# Patient Record
Sex: Male | Born: 1945 | Race: White | Hispanic: No | Marital: Married | Smoking: Current every day smoker
Health system: Southern US, Community
[De-identification: ages and names within clinical notes are randomized; demographics above are authoritative.]

## PROBLEM LIST (undated history)

## (undated) DIAGNOSIS — F1020 Alcohol dependence, uncomplicated: Secondary | ICD-10-CM

## (undated) DIAGNOSIS — B1920 Unspecified viral hepatitis C without hepatic coma: Secondary | ICD-10-CM

## (undated) DIAGNOSIS — C8590 Non-Hodgkin lymphoma, unspecified, unspecified site: Secondary | ICD-10-CM

## (undated) DIAGNOSIS — E119 Type 2 diabetes mellitus without complications: Secondary | ICD-10-CM

## (undated) DIAGNOSIS — I10 Essential (primary) hypertension: Secondary | ICD-10-CM

## (undated) DIAGNOSIS — Z720 Tobacco use: Secondary | ICD-10-CM

---

## 1998-08-05 ENCOUNTER — Ambulatory Visit (HOSPITAL_COMMUNITY): Admission: RE | Admit: 1998-08-05 | Discharge: 1998-08-05 | Payer: Self-pay | Admitting: Gastroenterology

## 1998-08-05 ENCOUNTER — Encounter: Payer: Self-pay | Admitting: Gastroenterology

## 1999-01-09 ENCOUNTER — Encounter: Admission: RE | Admit: 1999-01-09 | Discharge: 1999-04-09 | Payer: Self-pay | Admitting: Emergency Medicine

## 2000-12-09 ENCOUNTER — Ambulatory Visit (HOSPITAL_COMMUNITY): Admission: RE | Admit: 2000-12-09 | Discharge: 2000-12-09 | Payer: Self-pay | Admitting: Gastroenterology

## 2004-09-14 HISTORY — PX: DEEP NECK LYMPH NODE BIOPSY / EXCISION: SUR126

## 2005-08-28 ENCOUNTER — Emergency Department (HOSPITAL_COMMUNITY): Admission: EM | Admit: 2005-08-28 | Discharge: 2005-08-28 | Payer: Self-pay | Admitting: Emergency Medicine

## 2005-08-31 ENCOUNTER — Other Ambulatory Visit: Admission: RE | Admit: 2005-08-31 | Discharge: 2005-08-31 | Payer: Self-pay | Admitting: Otolaryngology

## 2005-09-17 ENCOUNTER — Ambulatory Visit (HOSPITAL_COMMUNITY): Admission: RE | Admit: 2005-09-17 | Discharge: 2005-09-17 | Payer: Self-pay | Admitting: Otolaryngology

## 2006-09-14 DIAGNOSIS — C8590 Non-Hodgkin lymphoma, unspecified, unspecified site: Secondary | ICD-10-CM

## 2006-09-14 HISTORY — DX: Non-Hodgkin lymphoma, unspecified, unspecified site: C85.90

## 2012-12-16 ENCOUNTER — Emergency Department (INDEPENDENT_AMBULATORY_CARE_PROVIDER_SITE_OTHER)
Admission: EM | Admit: 2012-12-16 | Discharge: 2012-12-16 | Disposition: A | Discharge status: Home / Self Care | Payer: Medicare Other | Source: Home / Self Care | Attending: Emergency Medicine | Admitting: Emergency Medicine

## 2012-12-16 ENCOUNTER — Encounter (HOSPITAL_COMMUNITY): Payer: Self-pay | Admitting: *Deleted

## 2012-12-16 DIAGNOSIS — K047 Periapical abscess without sinus: Secondary | ICD-10-CM

## 2012-12-16 HISTORY — DX: Non-Hodgkin lymphoma, unspecified, unspecified site: C85.90

## 2012-12-16 HISTORY — DX: Essential (primary) hypertension: I10

## 2012-12-16 HISTORY — DX: Type 2 diabetes mellitus without complications: E11.9

## 2012-12-16 MED ORDER — CLINDAMYCIN HCL 300 MG PO CAPS: 300.0000 mg | ORAL_CAPSULE | Freq: Four times a day (QID) | ORAL | Status: AC

## 2012-12-16 MED ORDER — HYDROCODONE-ACETAMINOPHEN 5-325 MG PO TABS
1.0000 | ORAL_TABLET | Freq: Once | ORAL | Status: AC
  Administered 2012-12-16: 1 via ORAL

## 2012-12-16 MED ORDER — HYDROCODONE-ACETAMINOPHEN 5-325 MG PO TABS: ORAL_TABLET | ORAL | Status: DC

## 2012-12-16 MED ORDER — IBUPROFEN 800 MG PO TABS
800.0000 mg | ORAL_TABLET | Freq: Once | ORAL | Status: AC
  Administered 2012-12-16: 800 mg via ORAL

## 2012-12-16 MED ORDER — NAPROXEN 500 MG PO TABS: 500.0000 mg | ORAL_TABLET | Freq: Two times a day (BID) | ORAL | Status: DC

## 2012-12-16 MED ORDER — IBUPROFEN 800 MG PO TABS
ORAL_TABLET | ORAL | Status: AC
  Filled 2012-12-16: qty 1

## 2012-12-16 MED ORDER — HYDROCODONE-ACETAMINOPHEN 5-325 MG PO TABS
ORAL_TABLET | ORAL | Status: AC
  Filled 2012-12-16: qty 1

## 2012-12-16 NOTE — ED Notes (Signed)
C/o swelling and pain to R lower jaw onset yesterday morning.  Can't afford to see his dentist.  Saw her in Jan. and spent $550.  Pt. goes to [facility] as his PCP.  They don't offer dental care but may pull teeth in an emergancy.

## 2012-12-16 NOTE — ED Provider Notes (Signed)
Chief Complaint:  Toothache.  History of Present Illness:   [name] is a 66 year old male with type 2 diabetes who presents with a one-day history of a painful tooth in his right lower jaw with swelling externally. It hurts to chew on that side. He is no difficulty swallowing or breathing. He denies headache, fever, neck pain, or difficulty breathing. He has had dental problems for years and had numerous crowns which, off in the teeth are not decayed. He has not have a dentist locally. He gets his medical care through the [place] system.  Review of Systems:  Other than noted above, the patient denies any of the following symptoms: Systemic:  No fever, chills, sweats or weight loss. ENT:  No headache, ear ache, sore throat, nasal congestion, facial pain, or swelling. Lymphatic:  No adenopathy. Lungs:  No coughing, wheezing or shortness of breath.  PMFSH:  Past medical history, family history, social history, meds, and allergies were reviewed. He has no known medication allergies. He has type 2 diabetes, non-Hodgkin's lymphoma which is under control, and diabetic peripheral neuropathy. He takes nortriptyline and a medication for diabetes and high blood pressure.  Physical Exam:   Vital signs:  BP 134/75  Pulse 81  Temp(Src) 98.4 F (36.9 C) (Oral)  Resp 16 General:  Alert, oriented, in no distress. ENT:  TMs and canals normal.  Nasal mucosa normal. Mouth exam:  He has widespread dental decay. Very few teeth are intact. There are multiple decayed teeth in the right lower jaw is hard to say which one it is infected. There is no swelling of the gingiva, no collection of pus, no swelling of the floor the mouth. The pharynx is clear and airway is widely patent. He does have swelling of the right side of the jaw externally but no erythema or heat. This is tender to palpation. Neck:  No swelling or adenopathy. Lungs:  Breath sounds clear and equal bilaterally.  No wheezes, rales or rhonchi. Heart:   Regular rhythm.  No gallops or murmers. Skin:  Clear, warm and dry.  Course in Urgent Care Center:   He was given ibuprofen 800 mg by mouth and Norco 5/325.   Assessment:  The encounter diagnosis was Dental abscess.  He has widespread dental decay he needs to see a dentist as soon as possible. He was given names of some dental resources here in the community, although he may want to go to the [place] system. He was warned that this could cause swelling of the tongue and difficulty breathing and to return to the emergency room right away if this should happen. There is no evidence right now of swelling of the floor the mouth.  Plan:   1.  The following meds were prescribed:   New Prescriptions   CLINDAMYCIN (CLEOCIN) 300 MG CAPSULE    Take 1 capsule (300 mg total) by mouth 4 (four) times daily.   HYDROCODONE-ACETAMINOPHEN (NORCO/VICODIN) 5-325 MG PER TABLET    1 to 2 tabs every 4 to 6 hours as needed for pain.   NAPROXEN (NAPROSYN) 500 MG TABLET    Take 1 tablet (500 mg total) by mouth 2 (two) times daily.   2.  The patient was instructed in symptomatic care and handouts were given. 3.  The patient was told to return if becoming worse in any way, if no better in 3 or 4 days, and given some red flag symptoms such as difficulty breathing or high fever that would indicate earlier  return, especially difficulty breathing. 4.  The patient was told to follow up with a dentist as soon as possible.    Reuben Likes, MD 12/16/12 [phone]

## 2014-05-06 ENCOUNTER — Emergency Department (HOSPITAL_COMMUNITY): Payer: Medicare Other

## 2014-05-06 ENCOUNTER — Encounter (HOSPITAL_COMMUNITY): Payer: Self-pay | Admitting: Emergency Medicine

## 2014-05-06 ENCOUNTER — Inpatient Hospital Stay (HOSPITAL_COMMUNITY)
Admission: EM | Admit: 2014-05-06 | Discharge: 2014-05-15 | DRG: 296 | Disposition: E | Payer: Medicare Other | Attending: Emergency Medicine | Admitting: Emergency Medicine

## 2014-05-06 ENCOUNTER — Inpatient Hospital Stay (HOSPITAL_COMMUNITY): Payer: Medicare Other

## 2014-05-06 DIAGNOSIS — I469 Cardiac arrest, cause unspecified: Principal | ICD-10-CM | POA: Diagnosis present

## 2014-05-06 DIAGNOSIS — N179 Acute kidney failure, unspecified: Secondary | ICD-10-CM | POA: Diagnosis present

## 2014-05-06 DIAGNOSIS — G931 Anoxic brain damage, not elsewhere classified: Secondary | ICD-10-CM | POA: Diagnosis present

## 2014-05-06 DIAGNOSIS — C8589 Other specified types of non-Hodgkin lymphoma, extranodal and solid organ sites: Secondary | ICD-10-CM | POA: Diagnosis present

## 2014-05-06 DIAGNOSIS — J9600 Acute respiratory failure, unspecified whether with hypoxia or hypercapnia: Secondary | ICD-10-CM

## 2014-05-06 DIAGNOSIS — R570 Cardiogenic shock: Secondary | ICD-10-CM | POA: Diagnosis present

## 2014-05-06 DIAGNOSIS — Z8249 Family history of ischemic heart disease and other diseases of the circulatory system: Secondary | ICD-10-CM

## 2014-05-06 DIAGNOSIS — I2699 Other pulmonary embolism without acute cor pulmonale: Secondary | ICD-10-CM | POA: Diagnosis present

## 2014-05-06 DIAGNOSIS — E876 Hypokalemia: Secondary | ICD-10-CM | POA: Diagnosis present

## 2014-05-06 DIAGNOSIS — B1920 Unspecified viral hepatitis C without hepatic coma: Secondary | ICD-10-CM | POA: Diagnosis present

## 2014-05-06 DIAGNOSIS — R4020 Unspecified coma: Secondary | ICD-10-CM | POA: Diagnosis present

## 2014-05-06 DIAGNOSIS — F1020 Alcohol dependence, uncomplicated: Secondary | ICD-10-CM | POA: Diagnosis present

## 2014-05-06 DIAGNOSIS — I4510 Unspecified right bundle-branch block: Secondary | ICD-10-CM | POA: Diagnosis present

## 2014-05-06 DIAGNOSIS — C8590 Non-Hodgkin lymphoma, unspecified, unspecified site: Secondary | ICD-10-CM | POA: Diagnosis present

## 2014-05-06 DIAGNOSIS — Z791 Long term (current) use of non-steroidal anti-inflammatories (NSAID): Secondary | ICD-10-CM | POA: Diagnosis not present

## 2014-05-06 DIAGNOSIS — E872 Acidosis, unspecified: Secondary | ICD-10-CM | POA: Diagnosis present

## 2014-05-06 DIAGNOSIS — J9601 Acute respiratory failure with hypoxia: Secondary | ICD-10-CM | POA: Diagnosis present

## 2014-05-06 DIAGNOSIS — I10 Essential (primary) hypertension: Secondary | ICD-10-CM | POA: Diagnosis present

## 2014-05-06 DIAGNOSIS — J4489 Other specified chronic obstructive pulmonary disease: Secondary | ICD-10-CM | POA: Diagnosis present

## 2014-05-06 DIAGNOSIS — J449 Chronic obstructive pulmonary disease, unspecified: Secondary | ICD-10-CM | POA: Diagnosis present

## 2014-05-06 DIAGNOSIS — Z66 Do not resuscitate: Secondary | ICD-10-CM | POA: Diagnosis present

## 2014-05-06 DIAGNOSIS — I498 Other specified cardiac arrhythmias: Secondary | ICD-10-CM | POA: Diagnosis present

## 2014-05-06 DIAGNOSIS — F17200 Nicotine dependence, unspecified, uncomplicated: Secondary | ICD-10-CM | POA: Diagnosis present

## 2014-05-06 DIAGNOSIS — E119 Type 2 diabetes mellitus without complications: Secondary | ICD-10-CM | POA: Diagnosis present

## 2014-05-06 HISTORY — DX: Tobacco use: Z72.0

## 2014-05-06 HISTORY — DX: Alcohol dependence, uncomplicated: F10.20

## 2014-05-06 HISTORY — DX: Unspecified viral hepatitis C without hepatic coma: B19.20

## 2014-05-06 LAB — PRO B NATRIURETIC PEPTIDE: Pro B Natriuretic peptide (BNP): 589.1 pg/mL — ABNORMAL HIGH (ref 0–125)

## 2014-05-06 LAB — BASIC METABOLIC PANEL
ANION GAP: 17 — AB (ref 5–15)
ANION GAP: 15 (ref 5–15)
BUN: 11 mg/dL (ref 6–23)
BUN: 14 mg/dL (ref 6–23)
CALCIUM: 6.9 mg/dL — AB (ref 8.4–10.5)
CO2: 19 meq/L (ref 19–32)
CO2: 21 meq/L (ref 19–32)
Calcium: 7.4 mg/dL — ABNORMAL LOW (ref 8.4–10.5)
Chloride: 98 mEq/L (ref 96–112)
Chloride: 96 mEq/L (ref 96–112)
Creatinine, Ser: 0.85 mg/dL (ref 0.50–1.35)
Creatinine, Ser: 0.97 mg/dL (ref 0.50–1.35)
GFR calc Af Amer: >90 mL/min (ref >90)
GFR calc Af Amer: >90 mL/min (ref >90)
GFR calc non Af Amer: 88 mL/min — ABNORMAL LOW (ref >90)
GFR, EST NON AFRICAN AMERICAN: 83 mL/min — AB (ref >90)
Glucose, Bld: 179 mg/dL — ABNORMAL HIGH (ref 70–99)
Glucose, Bld: 228 mg/dL — ABNORMAL HIGH (ref 70–99)
POTASSIUM: 4 meq/L (ref 3.7–5.3)
POTASSIUM: 4 meq/L (ref 3.7–5.3)
SODIUM: 134 meq/L — AB (ref 137–147)
SODIUM: 132 meq/L — AB (ref 137–147)

## 2014-05-06 LAB — POCT I-STAT, CHEM 8
BUN: 11 mg/dL (ref 6–23)
BUN: 14 mg/dL (ref 6–23)
BUN: 16 mg/dL (ref 6–23)
CALCIUM ION: 1.08 mmol/L — AB (ref 1.13–1.30)
CHLORIDE: 97 meq/L (ref 96–112)
CREATININE: 1.1 mg/dL (ref 0.50–1.35)
CREATININE: 1.1 mg/dL (ref 0.50–1.35)
Calcium, Ion: 1 mmol/L — ABNORMAL LOW (ref 1.13–1.30)
Calcium, Ion: 1.01 mmol/L — ABNORMAL LOW (ref 1.13–1.30)
Chloride: 100 mEq/L (ref 96–112)
Chloride: 99 mEq/L (ref 96–112)
Creatinine, Ser: 1 mg/dL (ref 0.50–1.35)
GLUCOSE: 192 mg/dL — AB (ref 70–99)
Glucose, Bld: 238 mg/dL — ABNORMAL HIGH (ref 70–99)
Glucose, Bld: 222 mg/dL — ABNORMAL HIGH (ref 70–99)
HCT: 45 % (ref 39.0–52.0)
HCT: 48 % (ref 39.0–52.0)
HEMATOCRIT: 48 % (ref 39.0–52.0)
HEMOGLOBIN: 15.3 g/dL (ref 13.0–17.0)
Hemoglobin: 16.3 g/dL (ref 13.0–17.0)
Hemoglobin: 16.3 g/dL (ref 13.0–17.0)
POTASSIUM: 4.2 meq/L (ref 3.7–5.3)
POTASSIUM: 3.9 meq/L (ref 3.7–5.3)
Potassium: 3.7 mEq/L (ref 3.7–5.3)
SODIUM: 135 meq/L — AB (ref 137–147)
Sodium: 135 mEq/L — ABNORMAL LOW (ref 137–147)
Sodium: 136 mEq/L — ABNORMAL LOW (ref 137–147)
TCO2: 21 mmol/L (ref 0–100)
TCO2: 22 mmol/L (ref 0–100)
TCO2: 22 mmol/L (ref 0–100)

## 2014-05-06 LAB — GLUCOSE, CAPILLARY
GLUCOSE-CAPILLARY: 172 mg/dL — AB (ref 70–99)
Glucose-Capillary: 222 mg/dL — ABNORMAL HIGH (ref 70–99)
Glucose-Capillary: 244 mg/dL — ABNORMAL HIGH (ref 70–99)

## 2014-05-06 LAB — POCT I-STAT 3, ART BLOOD GAS (G3+)
Acid-base deficit: 3 mmol/L — ABNORMAL HIGH (ref 0.0–2.0)
BICARBONATE: 22.1 meq/L (ref 20.0–24.0)
O2 SAT: 94 %
PCO2 ART: 33.6 mmHg — AB (ref 35.0–45.0)
PO2 ART: 58 mmHg — AB (ref 80.0–100.0)
Patient temperature: 33.2
TCO2: 23 mmol/L (ref 0–100)
pH, Arterial: 7.408 (ref 7.350–7.450)

## 2014-05-06 LAB — COMPREHENSIVE METABOLIC PANEL
ALT: 163 U/L — ABNORMAL HIGH (ref 0–53)
ANION GAP: 22 — AB (ref 5–15)
AST: 388 U/L — ABNORMAL HIGH (ref 0–37)
Albumin: 3 g/dL — ABNORMAL LOW (ref 3.5–5.2)
Alkaline Phosphatase: 101 U/L (ref 39–117)
BUN: 13 mg/dL (ref 6–23)
CALCIUM: 9.2 mg/dL (ref 8.4–10.5)
CO2: 20 meq/L (ref 19–32)
Chloride: 87 mEq/L — ABNORMAL LOW (ref 96–112)
Creatinine, Ser: 1.35 mg/dL (ref 0.50–1.35)
GFR, EST AFRICAN AMERICAN: 61 mL/min — AB (ref >90)
GFR, EST NON AFRICAN AMERICAN: 52 mL/min — AB (ref >90)
GLUCOSE: 464 mg/dL — AB (ref 70–99)
Potassium: 3.7 mEq/L (ref 3.7–5.3)
Sodium: 129 mEq/L — ABNORMAL LOW (ref 137–147)
TOTAL PROTEIN: 7.4 g/dL (ref 6.0–8.3)
Total Bilirubin: 0.4 mg/dL (ref 0.3–1.2)

## 2014-05-06 LAB — I-STAT ARTERIAL BLOOD GAS, ED
ACID-BASE DEFICIT: 11 mmol/L — AB (ref 0.0–2.0)
BICARBONATE: 19.7 meq/L — AB (ref 20.0–24.0)
O2 Saturation: 97 %
Patient temperature: 33.6
TCO2: 22 mmol/L (ref 0–100)
pCO2 arterial: 58.3 mmHg (ref 35.0–45.0)
pH, Arterial: 7.114 — CL (ref 7.350–7.450)
pO2, Arterial: 114 mmHg — ABNORMAL HIGH (ref 80.0–100.0)

## 2014-05-06 LAB — TROPONIN I: Troponin I: <0.3 ng/mL (ref <0.30)

## 2014-05-06 LAB — CBG MONITORING, ED: Glucose-Capillary: 282 mg/dL — ABNORMAL HIGH (ref 70–99)

## 2014-05-06 LAB — I-STAT CG4 LACTIC ACID, ED: Lactic Acid, Venous: 10.45 mmol/L — ABNORMAL HIGH (ref 0.5–2.2)

## 2014-05-06 LAB — CBC
HEMATOCRIT: 44.4 % (ref 39.0–52.0)
HEMOGLOBIN: 15.4 g/dL (ref 13.0–17.0)
MCH: 34.5 pg — AB (ref 26.0–34.0)
MCHC: 34.7 g/dL (ref 30.0–36.0)
MCV: 99.3 fL (ref 78.0–100.0)
Platelets: 128 10*3/uL — ABNORMAL LOW (ref 150–400)
RBC: 4.47 MIL/uL (ref 4.22–5.81)
RDW: 12.3 % (ref 11.5–15.5)
WBC: 13.1 10*3/uL — ABNORMAL HIGH (ref 4.0–10.5)

## 2014-05-06 LAB — PROTIME-INR
INR: 1.29 (ref 0.00–1.49)
INR: 1.6 — ABNORMAL HIGH (ref 0.00–1.49)
PROTHROMBIN TIME: 16.1 s — AB (ref 11.6–15.2)
PROTHROMBIN TIME: 19.1 s — AB (ref 11.6–15.2)

## 2014-05-06 LAB — I-STAT TROPONIN, ED: TROPONIN I, POC: 0.06 ng/mL (ref 0.00–0.08)

## 2014-05-06 LAB — MRSA PCR SCREENING: MRSA BY PCR: POSITIVE — AB

## 2014-05-06 LAB — MAGNESIUM: MAGNESIUM: 2.3 mg/dL (ref 1.5–2.5)

## 2014-05-06 LAB — APTT: APTT: 64 s — AB (ref 24–37)

## 2014-05-06 MED ORDER — CETYLPYRIDINIUM CHLORIDE 0.05 % MT LIQD
7.0000 mL | Freq: Four times a day (QID) | OROMUCOSAL | Status: DC
  Administered 2014-05-06 – 2014-05-07 (×4): 7 mL via OROMUCOSAL

## 2014-05-06 MED ORDER — ETOMIDATE 2 MG/ML IV SOLN
INTRAVENOUS | Status: AC
  Filled 2014-05-06: qty 20

## 2014-05-06 MED ORDER — ASPIRIN 300 MG RE SUPP: 300.0000 mg | RECTAL | Status: AC

## 2014-05-06 MED ORDER — CISATRACURIUM BOLUS VIA INFUSION
0.1000 mg/kg | Freq: Once | INTRAVENOUS | Status: AC
  Administered 2014-05-06: 9.1 mg via INTRAVENOUS
  Filled 2014-05-06: qty 10

## 2014-05-06 MED ORDER — ROCURONIUM BROMIDE 50 MG/5ML IV SOLN
INTRAVENOUS | Status: DC
Start: 2014-05-06 — End: 2014-05-06
  Filled 2014-05-06: qty 2

## 2014-05-06 MED ORDER — SODIUM CHLORIDE 0.9 % IV SOLN
10.0000 ug/h | INTRAVENOUS | Status: AC
  Administered 2014-05-06: 10 ug/h via INTRAVENOUS
  Filled 2014-05-06: qty 50

## 2014-05-06 MED ORDER — FENTANYL CITRATE 0.05 MG/ML IJ SOLN
INTRAMUSCULAR | Status: AC
  Filled 2014-05-06: qty 2

## 2014-05-06 MED ORDER — SODIUM CHLORIDE 0.9 % IV SOLN
1.0000 ug/kg/min | INTRAVENOUS | Status: DC
  Filled 2014-05-06: qty 20

## 2014-05-06 MED ORDER — SODIUM CHLORIDE 0.9 % IV SOLN
1.0000 ug/kg/min | INTRAVENOUS | Status: DC
  Administered 2014-05-06: 1 ug/kg/min via INTRAVENOUS
  Filled 2014-05-06: qty 20

## 2014-05-06 MED ORDER — SUCCINYLCHOLINE CHLORIDE 20 MG/ML IJ SOLN
INTRAMUSCULAR | Status: DC
Start: 2014-05-06 — End: 2014-05-06
  Filled 2014-05-06: qty 1

## 2014-05-06 MED ORDER — NOREPINEPHRINE BITARTRATE 1 MG/ML IV SOLN
0.5000 ug/min | INTRAVENOUS | Status: DC
  Administered 2014-05-06: 3 ug/min via INTRAVENOUS
  Filled 2014-05-06: qty 4

## 2014-05-06 MED ORDER — CISATRACURIUM BOLUS VIA INFUSION
0.0500 mg/kg | INTRAVENOUS | Status: DC | PRN
  Filled 2014-05-06: qty 5

## 2014-05-06 MED ORDER — PANTOPRAZOLE SODIUM 40 MG IV SOLR
40.0000 mg | INTRAVENOUS | Status: DC
  Administered 2014-05-06: 40 mg via INTRAVENOUS
  Filled 2014-05-06 (×2): qty 40

## 2014-05-06 MED ORDER — INSULIN ASPART 100 UNIT/ML ~~LOC~~ SOLN
2.0000 [IU] | SUBCUTANEOUS | Status: DC
  Administered 2014-05-06: 6 [IU] via SUBCUTANEOUS

## 2014-05-06 MED ORDER — NOREPINEPHRINE BITARTRATE 1 MG/ML IV SOLN
0.5000 ug/min | INTRAVENOUS | Status: DC
  Filled 2014-05-06: qty 4

## 2014-05-06 MED ORDER — SODIUM CHLORIDE 0.9 % IV BOLUS (SEPSIS)
1000.0000 mL | Freq: Once | INTRAVENOUS | Status: AC
  Administered 2014-05-06: 1000 mL via INTRAVENOUS

## 2014-05-06 MED ORDER — FENTANYL CITRATE 0.05 MG/ML IJ SOLN
25.0000 ug/h | INTRAMUSCULAR | Status: DC
  Administered 2014-05-06: 100 ug/h via INTRAVENOUS
  Administered 2014-05-07: 175 ug/h via INTRAVENOUS
  Filled 2014-05-06: qty 50

## 2014-05-06 MED ORDER — FENTANYL CITRATE 0.05 MG/ML IJ SOLN
100.0000 ug | Freq: Once | INTRAMUSCULAR | Status: AC
  Administered 2014-05-06: 100 ug via INTRAVENOUS

## 2014-05-06 MED ORDER — CALCIUM GLUCONATE 10 % IV SOLN
1.0000 g | Freq: Once | INTRAVENOUS | Status: AC
  Administered 2014-05-06: 1 g via INTRAVENOUS
  Filled 2014-05-06 (×2): qty 10

## 2014-05-06 MED ORDER — ASPIRIN 81 MG PO CHEW: 324.0000 mg | CHEWABLE_TABLET | Freq: Once | ORAL | Status: DC

## 2014-05-06 MED ORDER — SODIUM CHLORIDE 0.9 % IV SOLN: 0.5000 ug/kg/min | INTRAVENOUS | Status: DC

## 2014-05-06 MED ORDER — NOREPINEPHRINE BITARTRATE 1 MG/ML IV SOLN
2.0000 ug/min | INTRAVENOUS | Status: AC
  Administered 2014-05-06: 2 ug/min via INTRAVENOUS
  Filled 2014-05-06: qty 4

## 2014-05-06 MED ORDER — SODIUM CHLORIDE 0.9 % IV SOLN
1.0000 mg/h | INTRAVENOUS | Status: AC
  Administered 2014-05-06: 1 mg/h via INTRAVENOUS
  Filled 2014-05-06: qty 10

## 2014-05-06 MED ORDER — IPRATROPIUM-ALBUTEROL 0.5-2.5 (3) MG/3ML IN SOLN
3.0000 mL | Freq: Four times a day (QID) | RESPIRATORY_TRACT | Status: DC
  Administered 2014-05-06 – 2014-05-07 (×4): 3 mL via RESPIRATORY_TRACT
  Filled 2014-05-06 (×4): qty 3

## 2014-05-06 MED ORDER — ARTIFICIAL TEARS OP OINT
1.0000 "application " | TOPICAL_OINTMENT | Freq: Three times a day (TID) | OPHTHALMIC | Status: DC
  Administered 2014-05-06 – 2014-05-07 (×3): 1 via OPHTHALMIC
  Filled 2014-05-06: qty 3.5

## 2014-05-06 MED ORDER — SODIUM CHLORIDE 0.9 % IV SOLN
INTRAVENOUS | Status: DC
  Administered 2014-05-06: 1.5 [IU]/h via INTRAVENOUS
  Administered 2014-05-07: 2.5 [IU]/h via INTRAVENOUS
  Filled 2014-05-06: qty 2.5

## 2014-05-06 MED ORDER — FENTANYL BOLUS VIA INFUSION
50.0000 ug | INTRAVENOUS | Status: DC | PRN
  Filled 2014-05-06: qty 50

## 2014-05-06 MED ORDER — HEPARIN SODIUM (PORCINE) 5000 UNIT/ML IJ SOLN: 5000.0000 [IU] | Freq: Three times a day (TID) | INTRAMUSCULAR | Status: DC

## 2014-05-06 MED ORDER — EPINEPHRINE HCL 0.1 MG/ML IJ SOSY
PREFILLED_SYRINGE | INTRAMUSCULAR | Status: AC | PRN
  Administered 2014-05-06: 0.1 mg via INTRAVENOUS

## 2014-05-06 MED ORDER — SODIUM CHLORIDE 0.9 % IV SOLN
2000.0000 mL | Freq: Once | INTRAVENOUS | Status: AC
Start: 2014-05-06 — End: 2014-05-06

## 2014-05-06 MED ORDER — CISATRACURIUM BOLUS VIA INFUSION: 0.0500 mg/kg | INTRAVENOUS | Status: DC | PRN

## 2014-05-06 MED ORDER — LIDOCAINE HCL (CARDIAC) 20 MG/ML IV SOLN
INTRAVENOUS | Status: AC
  Filled 2014-05-06: qty 5

## 2014-05-06 MED ORDER — PROPOFOL 10 MG/ML IV EMUL
5.0000 ug/kg/min | INTRAVENOUS | Status: DC
  Administered 2014-05-06: 5 ug/kg/min via INTRAVENOUS
  Administered 2014-05-07: 50 ug/kg/min via INTRAVENOUS
  Administered 2014-05-07: 20 ug/kg/min via INTRAVENOUS
  Filled 2014-05-06 (×4): qty 100

## 2014-05-06 MED ORDER — MIDAZOLAM HCL 2 MG/2ML IJ SOLN
INTRAMUSCULAR | Status: AC
  Filled 2014-05-06: qty 2

## 2014-05-06 MED ORDER — SODIUM CHLORIDE 0.9 % IV SOLN: 1.0000 ug/kg/min | INTRAVENOUS | Status: DC

## 2014-05-06 MED ORDER — CHLORHEXIDINE GLUCONATE 0.12 % MT SOLN
15.0000 mL | Freq: Two times a day (BID) | OROMUCOSAL | Status: DC
  Administered 2014-05-06 – 2014-05-07 (×2): 15 mL via OROMUCOSAL
  Filled 2014-05-06 (×2): qty 15

## 2014-05-06 MED ORDER — POTASSIUM CHLORIDE 10 MEQ/50ML IV SOLN
10.0000 meq | INTRAVENOUS | Status: AC
  Administered 2014-05-06 – 2014-05-07 (×2): 10 meq via INTRAVENOUS
  Filled 2014-05-06: qty 50

## 2014-05-06 MED ORDER — ASPIRIN 300 MG RE SUPP
300.0000 mg | RECTAL | Status: AC
  Administered 2014-05-06: 300 mg via RECTAL
  Filled 2014-05-06 (×2): qty 1

## 2014-05-06 MED ORDER — SODIUM CHLORIDE 0.9 % IV SOLN
2000.0000 mL | Freq: Once | INTRAVENOUS | Status: AC
  Administered 2014-05-06: 2000 mL via INTRAVENOUS

## 2014-05-06 MED ORDER — FENTANYL CITRATE 0.05 MG/ML IJ SOLN: 100.0000 ug | Freq: Once | INTRAMUSCULAR | Status: AC | PRN

## 2014-05-06 MED ORDER — CISATRACURIUM BOLUS VIA INFUSION: 0.1000 mg/kg | Freq: Once | INTRAVENOUS | Status: DC

## 2014-05-06 MED ORDER — HEPARIN BOLUS VIA INFUSION
5000.0000 [IU] | Freq: Once | INTRAVENOUS | Status: AC
  Administered 2014-05-06: 5000 [IU] via INTRAVENOUS
  Filled 2014-05-06: qty 5000

## 2014-05-06 MED ORDER — HEPARIN (PORCINE) IN NACL 100-0.45 UNIT/ML-% IJ SOLN
850.0000 [IU]/h | INTRAMUSCULAR | Status: DC
  Administered 2014-05-06: 850 [IU]/h via INTRAVENOUS
  Filled 2014-05-06 (×3): qty 250

## 2014-05-06 MED ORDER — SODIUM CHLORIDE 0.9 % IV SOLN
Freq: Once | INTRAVENOUS | Status: AC
  Administered 2014-05-06: 17:00:00 via INTRAVENOUS

## 2014-05-06 NOTE — ED Notes (Signed)
Pt arrived via [facility] with CPR in progress. Pt's spouse reports to EMS pt went to lay down and take a nap 1330-1345 and she found him in the bed unresponsive at apporx 1445. Pt was asystole upon EMS arrival, CPR started, they got a return of pulses, pt then went into V-Fib, 1 cardiac shock was delivered and pt went into PEA where he remained in PEA upon arrival to ED, pt remained in [place] airway established, 18 G LAC with 1 L cold NS infusing upon arrival, pt arrived with c-collar, LSB and head blocks in place. EMS administered Epi x6, 1 Amp D-50.

## 2014-05-06 NOTE — Progress Notes (Signed)
[place] Progress Note Patient Name: [name] DOB: 06/04/1946 MRN: [id number]   Date of Service  04/25/2014  HPI/Events of Note  k low , cooled  eICU Interventions  supp k iv     Intervention Category Major Interventions: Electrolyte abnormality - evaluation and management  Raylene Miyamoto. 05/06/2014, 11:34 PM

## 2014-05-06 NOTE — ED Notes (Signed)
MD Tyrone Nine at bedside to place a central line

## 2014-05-06 NOTE — Procedures (Signed)
Arterial Catheter Insertion Procedure Note [name] [id number] [birth year]  Procedure: Insertion of Arterial Catheter  Indications: Blood pressure monitoring and Frequent blood sampling  Procedure Details Consent: Unable to obtain consent because of emergent medical necessity. Time Out: Verified patient identification, verified procedure, site/side was marked, verified correct patient position, special equipment/implants available, medications/allergies/relevent history reviewed, required imaging and test results available.  Performed  Maximum sterile technique was used including antiseptics, cap, gloves, gown, hand hygiene, mask and sheet. Skin prep: Chlorhexidine; local anesthetic administered 20 gauge catheter was inserted into left radial artery using the Seldinger technique.  Evaluation Blood flow good; BP tracing good. Complications: No apparent complications. Rt placed arrow catheter on first attempt left radial.   [name] 05/05/2014

## 2014-05-06 NOTE — Progress Notes (Signed)
MD aware of critical ABG results. RR increased to 20.

## 2014-05-06 NOTE — Progress Notes (Signed)
ANTICOAGULATION CONSULT NOTE - Initial Consult  Pharmacy Consult for heparin Indication: pulmonary embolus  No Known Allergies  Patient Measurements: Height: 5\' 9"  (175.3 cm) Weight: 195 lb 8.8 oz (88.7 kg) IBW/kg (Calculated) : 70.7 Heparin Dosing Weight: 88.7 kg  Vital Signs: Temp: 91.9 F (33.3 C) (08/23 1930) Temp src: Core (Comment) (08/23 1930) BP: 176/75 mmHg (08/23 1845) Pulse Rate: 50 (08/23 1845)  Labs:  Recent Labs  04/14/2014 1531  HGB 15.4  HCT 44.4  PLT 128*  APTT 64*  LABPROT 16.1*  INR 1.29  CREATININE 1.35  TROPONINI <0.30    Estimated Creatinine Clearance: 57.7 ml/min (by C-G formula based on Cr of 1.35).   Medical History: Past Medical History  Diagnosis Date  . Non Hodgkin's lymphoma 2008  . Diabetes mellitus without complication     No longer on medications after weight loss  . Hypertension   . Hepatitis C     Never treated  . Alcoholism   . Tobacco abuse     Medications:  Prescriptions prior to admission  Medication Sig Dispense Refill  . clindamycin (CLEOCIN) 300 MG capsule Take 1 capsule (300 mg total) by mouth 4 (four) times daily.  40 capsule  0  . HYDROcodone-acetaminophen (NORCO/VICODIN) 5-325 MG per tablet 1 to 2 tabs every 4 to 6 hours as needed for pain.  20 tablet  0  . loratadine (CLARITIN) 10 MG tablet Take 10 mg by mouth daily.      Marland Kitchen losartan (COZAAR) 50 MG tablet Take 50 mg by mouth daily.      . metFORMIN (GLUCOPHAGE) 500 MG tablet Take 500 mg by mouth 3 (three) times daily.      . naproxen (NAPROSYN) 500 MG tablet Take 1 tablet (500 mg total) by mouth 2 (two) times daily.  30 tablet  0  . nortriptyline (PAMELOR) 25 MG capsule Take 100 mg by mouth at bedtime.        Assessment: 68 yo man brought in as cardiac arrest and artic sun protocol started.  Now to start heparin for ?PE.  Baseline INR slightly elevated.   Goal of Therapy:  Heparin level 0.3-0.7 units/ml Monitor platelets by anticoagulation protocol: Yes    Plan:  Heparin 5000 units bolus and drip at 850 units/hr Will check heparin level 6 hours after bolus   Thanks for allowing pharmacy to be a part of this patient's care.  Excell Seltzer, PharmD Clinical Pharmacist, [phone] 05/06/2014,8:05 PM

## 2014-05-06 NOTE — ED Provider Notes (Signed)
CSN: [id number]     Arrival date & time 05/08/2014  1523 History   First MD Initiated Contact with Patient 04/15/2014 1530     Chief Complaint  Patient presents with  . Cardiac Arrest     (Consider location/radiation/quality/duration/timing/severity/associated sxs/prior Treatment) Patient is a 68 y.o. male presenting with general illness. The history is provided by the EMS personnel.  Illness Severity:  Mild Onset quality:  Sudden Duration:  1 hour Timing:  Constant Progression:  Unchanged Chronicity:  New  68 yo M with a chief complaint unresponsiveness. Family found him after being on for about an hour patient was unresponsive EMS was called to the Patient had aching airway placed in compressions were started with a Linton Rump device. He is given sixth round of epi and one amp of D50 with return of pulses. Patient was in his foot initially in asystole. Cool extremities noted brought in boarded and collared. Pulse pulses again in route started compressions back. Given 3 more round of epi.  Family with additional history noted that the patient had taken a nap and was down for approximately an hour. He noted that he was really cold and didn't look right and they rolled him over and noticed that his face was blue. Patient with history of non-Hodgkin's lymphoma with reoccurrence. So heavy smoker and drinker. No noted cardiac history.  Past Medical History  Diagnosis Date  . Non Hodgkin's lymphoma   . Diabetes mellitus without complication   . Hypertension    Past Surgical History  Procedure Laterality Date  . Deep neck lymph node biopsy / excision Left 2006   Family History  Problem Relation Age of Onset  . Heart failure Mother   . Heart attack Father    History  Substance Use Topics  . Smoking status: Current Every Day Smoker -- 1.00 packs/day    Types: Cigarettes  . Smokeless tobacco: Not on file  . Alcohol Use: 1.8 oz/week    3 Shots of liquor per week    Review of Systems   Unable to perform ROS: Patient unresponsive      Allergies  Review of patient's allergies indicates no known allergies.  Home Medications   Prior to Admission medications   Medication Sig Start Date End Date Taking? Authorizing Provider  clindamycin (CLEOCIN) 300 MG capsule Take 1 capsule (300 mg total) by mouth 4 (four) times daily. 12/16/12   Harden Mo, MD  HYDROcodone-acetaminophen (NORCO/VICODIN) 5-325 MG per tablet 1 to 2 tabs every 4 to 6 hours as needed for pain. 12/16/12   Harden Mo, MD  loratadine (CLARITIN) 10 MG tablet Take 10 mg by mouth daily.    Historical Provider, MD  losartan (COZAAR) 50 MG tablet Take 50 mg by mouth daily.    Historical Provider, MD  metFORMIN (GLUCOPHAGE) 500 MG tablet Take 500 mg by mouth 3 (three) times daily.    Historical Provider, MD  naproxen (NAPROSYN) 500 MG tablet Take 1 tablet (500 mg total) by mouth 2 (two) times daily. 12/16/12   Harden Mo, MD  nortriptyline (PAMELOR) 25 MG capsule Take 100 mg by mouth at bedtime.    Historical Provider, MD   BP 153/61  Pulse 51  Temp(Src) 91.8 F (33.2 C) (Core (Comment))  Resp 18  Wt 200 lb 14.4 oz (91.128 kg)  SpO2 100% Physical Exam  Constitutional: He appears well-developed.  HENT:  Head: Normocephalic and atraumatic.  Eyes: Right eye exhibits no discharge. Left eye exhibits no discharge.  No scleral icterus.  Neck: Neck supple. No tracheal deviation present.  Cardiovascular:  Pulseless, cool extremities  Pulmonary/Chest: Bradypnea noted. No respiratory distress.  Coarse breath sounds bilaterally,   Abdominal: He exhibits distension. There is no tenderness. There is no rebound and no guarding.  Neurological: He is unresponsive. GCS eye subscore is 1. GCS verbal subscore is 1. GCS motor subscore is 1.  Skin: He is diaphoretic. There is pallor.  cool    ED Course  INTUBATION Date/Time: 05/02/2014 6:13 PM Performed by: Deno Etienne Authorized by: Deno Etienne Consent: The procedure  was performed in an emergent situation. Patient identity confirmed: hospital-assigned identification number Time out: Immediately prior to procedure a "time out" was called to verify the correct patient, procedure, equipment, support staff and site/side marked as required. Indications: airway protection Intubation method: direct Patient status: unconscious Preoxygenation: king airway. Laryngoscope size: Mac 4 Tube size: 7.5 mm Tube type: cuffed Number of attempts: 1 Cricoid pressure: yes Cords visualized: yes Post-procedure assessment: chest rise and CO2 detector Breath sounds: equal Cuff inflated: yes ETT to lip: 23 cm Tube secured with: ETT holder Chest x-ray interpreted by me and radiologist. Chest x-ray findings: endotracheal tube in appropriate position Patient tolerance: Patient tolerated the procedure well with no immediate complications.  CENTRAL LINE Date/Time: 04/15/2014 6:14 PM Performed by: Deno Etienne Authorized by: Deno Etienne Consent: Verbal consent obtained. Risks and benefits: risks, benefits and alternatives were discussed Consent given by: spouse Required items: required blood products, implants, devices, and special equipment available Patient identity confirmed: hospital-assigned identification number Time out: Immediately prior to procedure a "time out" was called to verify the correct patient, procedure, equipment, support staff and site/side marked as required. Indications: central pressure monitoring and vascular access Patient sedated: no Preparation: skin prepped with 2% chlorhexidine Skin prep agent dried: skin prep agent completely dried prior to procedure Sterile barriers: all five maximum sterile barriers used - cap, mask, sterile gown, sterile gloves, and large sterile sheet Hand hygiene: hand hygiene performed prior to central venous catheter insertion Location details: right subclavian Site selection rationale: c collar in place Patient position:  Trendelenburg Catheter type: triple lumen Pre-procedure: landmarks identified Ultrasound guidance: no Number of attempts: 1 Successful placement: yes Post-procedure: line sutured and dressing applied Assessment: blood return through all ports and placement verified by x-ray Patient tolerance: Patient tolerated the procedure well with no immediate complications.   (including critical care time) Labs Review Labs Reviewed  CBC - Abnormal; Notable for the following:    WBC 13.1 (*)    MCH 34.5 (*)    Platelets 128 (*)    All other components within normal limits  COMPREHENSIVE METABOLIC PANEL - Abnormal; Notable for the following:    Sodium 129 (*)    Chloride 87 (*)    Glucose, Bld 464 (*)    Albumin 3.0 (*)    AST 388 (*)    ALT 163 (*)    GFR calc non Af Amer 52 (*)    GFR calc Af Amer 61 (*)    Anion gap 22 (*)    All other components within normal limits  PRO B NATRIURETIC PEPTIDE - Abnormal; Notable for the following:    Pro B Natriuretic peptide (BNP) 589.1 (*)    All other components within normal limits  PROTIME-INR - Abnormal; Notable for the following:    Prothrombin Time 16.1 (*)    All other components within normal limits  APTT - Abnormal; Notable for the following:  aPTT 64 (*)    All other components within normal limits  I-STAT CG4 LACTIC ACID, ED - Abnormal; Notable for the following:    Lactic Acid, Venous 10.45 (*)    All other components within normal limits  CBG MONITORING, ED - Abnormal; Notable for the following:    Glucose-Capillary 282 (*)    All other components within normal limits  I-STAT ARTERIAL BLOOD GAS, ED - Abnormal; Notable for the following:    pH, Arterial 7.114 (*)    pCO2 arterial 58.3 (*)    pO2, Arterial 114.0 (*)    Bicarbonate 19.7 (*)    Acid-base deficit 11.0 (*)    All other components within normal limits  MAGNESIUM  TROPONIN I  BLOOD GAS, ARTERIAL  I-STAT TROPOININ, ED    Imaging Review Ct Head Wo  Contrast  05/02/2014   CLINICAL DATA:  Acute mental status change.  EXAM: CT HEAD WITHOUT CONTRAST  CT CERVICAL SPINE WITHOUT CONTRAST  TECHNIQUE: Multidetector CT imaging of the head and cervical spine was performed following the standard protocol without intravenous contrast. Multiplanar CT image reconstructions of the cervical spine were also generated.  COMPARISON:  None.  FINDINGS: CT HEAD FINDINGS  The patient is intubated. No acute cortical infarct, hemorrhage, or mass lesion ispresent. Ventricles are of normal size. No significant extra-axial fluid collection is present. The paranasal sinuses andmastoid air cells are clear. The osseous skull is intact.  CT CERVICAL SPINE FINDINGS  Normal alignment of the cervical spine. The vertebral body heights are well preserved. There is mild multi level disc space narrowing and ventral endplate spurring. This is most advanced at C6-7 and C7-T1. The facet joints are aligned. There is bilateral facet hypertrophy and degenerative change. The prevertebral soft tissue space appears within normal limits. Calcified atherosclerotic disease involves the carotid arteries.  IMPRESSION: 1. No acute intracranial abnormalities. 2. No evidence for cervical spine fracture or dislocation 3. Cervical degenerative disc disease 4. Atherosclerotic disease.   Electronically Signed   By: Kerby Moors M.D.   On: 05/03/2014 16:40   Ct Cervical Spine Wo Contrast  05/04/2014   CLINICAL DATA:  Acute mental status change.  EXAM: CT HEAD WITHOUT CONTRAST  CT CERVICAL SPINE WITHOUT CONTRAST  TECHNIQUE: Multidetector CT imaging of the head and cervical spine was performed following the standard protocol without intravenous contrast. Multiplanar CT image reconstructions of the cervical spine were also generated.  COMPARISON:  None.  FINDINGS: CT HEAD FINDINGS  The patient is intubated. No acute cortical infarct, hemorrhage, or mass lesion ispresent. Ventricles are of normal size. No significant  extra-axial fluid collection is present. The paranasal sinuses andmastoid air cells are clear. The osseous skull is intact.  CT CERVICAL SPINE FINDINGS  Normal alignment of the cervical spine. The vertebral body heights are well preserved. There is mild multi level disc space narrowing and ventral endplate spurring. This is most advanced at C6-7 and C7-T1. The facet joints are aligned. There is bilateral facet hypertrophy and degenerative change. The prevertebral soft tissue space appears within normal limits. Calcified atherosclerotic disease involves the carotid arteries.  IMPRESSION: 1. No acute intracranial abnormalities. 2. No evidence for cervical spine fracture or dislocation 3. Cervical degenerative disc disease 4. Atherosclerotic disease.   Electronically Signed   By: Kerby Moors M.D.   On: 04/20/2014 16:40   Dg Chest Portable 1 View  05/01/2014   CLINICAL DATA:  Cardiac arrest.  Central line placement.  EXAM: PORTABLE CHEST - 1 VIEW  COMPARISON:  04/23/2014.  FINDINGS: 1733 hr. Tip of the endotracheal tube is in the mid trachea. A nasogastric tube projects just below the diaphragm, although its side port is in the distal esophagus. Right subclavian central venous catheter extends to the mid SVC level. The heart size is stable. There is interval improved aeration of both lung bases. No pneumothorax or significant pleural effusion is seen. Telemetry leads and an external pacer overlie the chest. No fractures are seen.  IMPRESSION: Support system positioning as described. The nasogastric tube projects into the proximal stomach. Interval improved aeration of both lung bases.   Electronically Signed   By: Camie Patience M.D.   On: 04/29/2014 17:54   Dg Chest Port 1 View  05/01/2014   CLINICAL DATA:  Cardiac arrest  EXAM: PORTABLE CHEST - 1 VIEW  COMPARISON:  None.  FINDINGS: Endotracheal tube tip is 4.6 cm above the carina. Nasogastric tube tip is in the stomach with the side port at the gastroesophageal  junction. No pneumothorax. There is left lower lobe consolidation. Lungs are otherwise clear. Heart is prominent with pulmonary vascularity within normal limits. No adenopathy. There is atherosclerotic change in aorta. No bone lesions.  IMPRESSION: Tube positions as described without pneumothorax. Note that the nasogastric tube side port is at the level of the gastroesophageal junction. It may be prudent to consider advancing nasogastric tube 6 to 8 cm. Left lower lobe consolidation. Lungs elsewhere clear.   Electronically Signed   By: Lowella Grip M.D.   On: 05/05/2014 16:12   Dg Abd Portable 1v  05/04/2014   CLINICAL DATA:  Nasogastric tube placement  EXAM: PORTABLE ABDOMEN - 1 VIEW  COMPARISON:  None.  FINDINGS: Nasogastric tube tip is in the stomach with the side port at the gastroesophageal junction. Bowel gas pattern is overall unremarkable. No free air is seen on this supine examination.  IMPRESSION: Nasogastric tube tip in proximal stomach with side port at the gastroesophageal junction. Consider advancing tube 6 to 8 cm. Bowel gas pattern overall unremarkable.   Electronically Signed   By: Lowella Grip M.D.   On: 04/26/2014 16:12     EKG Interpretation   Date/Time:  Sunday May 06 2014 15:28:10 EDT Ventricular Rate:  82 PR Interval:  252 QRS Duration: 131 QT Interval:  393 QTC Calculation: 459 R Axis:   -75 Text Interpretation:  Sinus or ectopic atrial rhythm Prolonged PR interval  Consider left atrial enlargement Right bundle branch block Anteroseptal  infarct, age indeterminate Lateral leads are also involved Sinus rhythm  Right bundle branch block T wave abnormality Left axis deviation Abnormal  ekg Confirmed by Carmin Muskrat  MD [id number]) on 04/30/2014 6:05:53 PM      MDM   Final diagnoses:  Cardiac arrest  Lactic acidosis    68 yo M with a chief complaint of cardiac arrest. Patient came to the EEG and CPR was given one round of epi pulses were checked and  patient had intact pulses neuro complex EKG. No noted ischemic ST changes. Patient initially hypertensive after reading of pulses. Patient remained unresponsive was intubated for airway protection. Patient became hypotensive was started on norepinephrine. Patient remained stable with 5 mics of nor epi. A central line was placed.   No noted intracranial bleeding. Critical care was counseled for admission.    Deno Etienne, MD 04/16/2014 [phone]

## 2014-05-06 NOTE — ED Notes (Addendum)
MD Bynum with CCM at bedside.

## 2014-05-06 NOTE — Progress Notes (Signed)
[place] Progress Note Patient Name: LEOMAR WESTBERG DOB: 1946/04/08 MRN: [id number]   Date of Service  04/29/2014  HPI/Events of Note    eICU Interventions  Hypocalcemia- repleted     Intervention Category Intermediate Interventions: Electrolyte abnormality - evaluation and management  Oaklyn Jakubek V. 05/02/2014, 10:55 PM

## 2014-05-06 NOTE — ED Notes (Addendum)
I Stat Lactic Acid results shown to Dr. Ellin Mayhew

## 2014-05-06 NOTE — H&P (Signed)
PULMONARY / CRITICAL CARE MEDICINE   Name: [name] MRN: [id number] DOB: [birth year]    ADMISSION DATE:  04/28/2014  CHIEF COMPLAINT:  Cardiac Arrest  INITIAL PRESENTATION:  68 year old man found unresponsive 8/23, initial rhythm asystole. [place] after approximately 35 minutes. Telemetry sinus bradycardia with right bundle branch block. Initial neurological exam GCS 3. Hyperthermia initiated 8/23.   STUDIES:  Head CT 8/23 >> no intracranial abnormality C-spine CT 8/23>> no evidence for cervical spine fracture or dislocation Abdominal x-ray 8/23>> normal bowel gas pattern Chest x-ray 8/23 >> is significant infiltrates, ETT, CVC in good position  SIGNIFICANT EVENTS:  HISTORY OF PRESENT ILLNESS:  67 year old man with a history of recurrent non-Hodgkin's lymphoma, hypertension, alcoholism and tobacco abuse. He has some functional limitation but his wife indicates he was at baseline early on the day of admission. He went to take a nap and was then found approximately one hour later completely unresponsive and cyanotic. 10 minutes of bystander CPR was performed for EMS arrived. First rhythm was asystole. A perfusing rhythm was achieved by EMS but then lost in route. On arrival to the emergency department he was PEA, responding to epinephrine. In ED sinus brady with RBBB.  His down times difficult to determine but is estimated at 35 minutes. He was started on norepinephrine and achieved a stable blood pressure. He was comatose in the ED on no sedation.  Decision was made to initiate hypothermia. Discussions undertaken with family who agreed with short term support to assess potential for recovery but NOT prolonged support.   PAST MEDICAL HISTORY :  Past Medical History  Diagnosis Date  . Non Hodgkin's lymphoma 2008  . Diabetes mellitus without complication     No longer on medications after weight loss  . Hypertension   . Hepatitis C     Never treated  . Alcoholism   . Tobacco abuse    Past  Surgical History  Procedure Laterality Date  . Deep neck lymph node biopsy / excision Left 2006   Prior to Admission medications   Medication Sig Start Date End Date Taking? Authorizing Provider  clindamycin (CLEOCIN) 300 MG capsule Take 1 capsule (300 mg total) by mouth 4 (four) times daily. 12/16/12   Harden Mo, MD  HYDROcodone-acetaminophen (NORCO/VICODIN) 5-325 MG per tablet 1 to 2 tabs every 4 to 6 hours as needed for pain. 12/16/12   Harden Mo, MD  loratadine (CLARITIN) 10 MG tablet Take 10 mg by mouth daily.    Historical Provider, MD  losartan (COZAAR) 50 MG tablet Take 50 mg by mouth daily.    Historical Provider, MD  metFORMIN (GLUCOPHAGE) 500 MG tablet Take 500 mg by mouth 3 (three) times daily.    Historical Provider, MD  naproxen (NAPROSYN) 500 MG tablet Take 1 tablet (500 mg total) by mouth 2 (two) times daily. 12/16/12   Harden Mo, MD  nortriptyline (PAMELOR) 25 MG capsule Take 100 mg by mouth at bedtime.    Historical Provider, MD   No Known Allergies  FAMILY HISTORY:  Family History  Problem Relation Age of Onset  . Heart failure Mother   . Heart attack Father    SOCIAL HISTORY:  reports that he has been smoking Cigarettes.  He has a 90 pack-year smoking history. He does not have any smokeless tobacco history on file. He reports that he drinks alcohol. He reports that he does not use illicit drugs.  REVIEW OF SYSTEMS:  Unable to obtain, family indicated he  was at baseline on the morning of event  SUBJECTIVE:   VITAL SIGNS: Temp:  [91.8 F (33.2 C)] 91.8 F (33.2 C) (08/23 1714) Pulse Rate:  [51-77] 51 (08/23 1725) Resp:  [16-20] 18 (08/23 1725) BP: (91-216)/(48-94) 153/61 mmHg (08/23 1725) SpO2:  [99 %-100 %] 100 % (08/23 1725) FiO2 (%):  [100 %] 100 % (08/23 1544) Weight:  [91.128 kg (200 lb 14.4 oz)] 91.128 kg (200 lb 14.4 oz) (08/23 1650) HEMODYNAMICS:   VENTILATOR SETTINGS: Vent Mode:  [-] PRVC FiO2 (%):  [100 %] 100 % Set Rate:  [16 bmp-20  bmp] 20 bmp Vt Set:  [600 mL] 600 mL PEEP:  [5 cmH20] 5 cmH20 Plateau Pressure:  [17 cmH20] 17 cmH20 INTAKE / OUTPUT:  Intake/Output Summary (Last 24 hours) at 05/08/2014 1825 Last data filed at 04/29/2014 1816  Gross per 24 hour  Intake   3000 ml  Output   1650 ml  Net   1350 ml    PHYSICAL EXAMINATION: General:  Ill appearing, intubated Neuro:  Unresponsive to pain, voice. Doll's eyes intact, R pupil 3, L pupil 4, both sluggish HEENT:  ETT, OGT, no lesions Cardiovascular:  Loletha Grayer, regular Lungs:  Clear B Abdomen:  Slightly distended, soft, no BS Musculoskeletal:  No deformities, no edema Skin:  No rash, cool  LABS:  CBC  Recent Labs Lab 04/15/2014 1531  WBC 13.1*  HGB 15.4  HCT 44.4  PLT 128*   Coag's  Recent Labs Lab 05/08/2014 1531  APTT 64*  INR 1.29   BMET  Recent Labs Lab 05/03/2014 1531  NA 129*  K 3.7  CL 87*  CO2 20  BUN 13  CREATININE 1.35  GLUCOSE 464*   Electrolytes  Recent Labs Lab 04/30/2014 1531  CALCIUM 9.2  MG 2.3   Sepsis Markers  Recent Labs Lab 04/17/2014 1541  LATICACIDVEN 10.45*   ABG  Recent Labs Lab 05/05/2014 1638  PHART 7.114*  PCO2ART 58.3*  PO2ART 114.0*   Liver Enzymes  Recent Labs Lab 05/08/2014 1531  AST 388*  ALT 163*  ALKPHOS 101  BILITOT 0.4  ALBUMIN 3.0*   Cardiac Enzymes  Recent Labs Lab 05/01/2014 1531  TROPONINI <0.30  PROBNP 589.1*   Glucose  Recent Labs Lab 05/04/2014 1655  GLUCAP 282*    Imaging No results found.   ASSESSMENT / PLAN:  PULMONARY OETT 8/23 >>  A: Acute respiratory failure Heavy tobacco abuse and presumed COPD P:   - Vent orders initiated - ABG on arrival to the ICU - Scheduled bronchodilators  CARDIOVASCULAR CVL right subclavian 8/23>>  A: Cardiac arrest, etiology unclear. ECG does not appear consistent with acute MI. Consider PE given his history of malignancy Cardiogenic shock Bradycardia P:  - hypothermia protocol initiated - Start empiric heparin  anticoagulation given the possibility of PE. Want to avoid contrast dye load in the aftermath of renal injury. Once his kidney function can be better defined post-arrest then we could consider a CT scan to rule out PE definitively - Norepinephrine for blood pressure support - Cardiology has been consulted. At this point there is likely no indication to take him immediately to the cath lab  RENAL A:  Acute renal failure P:   - follow urine output and BMP with volume resuscitation and pressor support  GASTROINTESTINAL A:  Acid prophylaxis P:   - PPI  HEMATOLOGIC A:  Non-Hodgkin's lymphoma. Per the patient's wife his most recent PET scan has shown recurrence. He has been hesitant to restart  chemotherapy and has indicated that he would not want to take chemotherapy if it would negatively impact his quality of life.  DVT prophylaxis, possible pulmonary embolism P:  - heparin drip initiated as above  INFECTIOUS A:  No evidence for active infection P:   BCx2  UC  Sputum - surveillance off antibiotics, low threshold to start depending on clinical signs  ENDOCRINE A:  Diabetes mellitus, treated with diet   P:   - ICU hyperglycemia protocol while critically ill  NEUROLOGIC A:  Probable hypoxic neurologic injury P:   RASS goal: -2 - discussed in detail with his family. They understand that there is a significant chance that he will not recover to accept baseline. They advocate following him through the hypothermia process and then assessing his neurological status. They would not want to extend his critical care support beyond 2-3 days if he were not showing signs of good neurological recovery.   GLOBAL Code status >> I have explained to the family 8/23 that his chances for meaningful recovery become even smaller if he were to suffer a second arrest. For this reason I do not recommend CPR or ACLS if he decompensates. They understand and agree. We will continue all active and other  medical interventions to support him, but he will not undergo CPR in the event of another arrest.   TODAY'S SUMMARY:   I have personally obtained a history, examined the patient, evaluated laboratory and imaging results, formulated the assessment and plan and placed orders. CRITICAL CARE: The patient is critically ill with multiple organ systems failure and requires high complexity decision making for assessment and support, frequent evaluation and titration of therapies, application of advanced monitoring technologies and extensive interpretation of multiple databases. Critical Care Time devoted to patient care services described in this note is 90 minutes.   Baltazar Apo, MD, PhD 05/04/2014, 6:57 PM [facility] [phone] or if no answer [phone]

## 2014-05-07 ENCOUNTER — Inpatient Hospital Stay (HOSPITAL_COMMUNITY): Payer: Medicare Other

## 2014-05-07 ENCOUNTER — Encounter (HOSPITAL_COMMUNITY): Payer: Self-pay | Admitting: Radiology

## 2014-05-07 DIAGNOSIS — C8589 Other specified types of non-Hodgkin lymphoma, extranodal and solid organ sites: Secondary | ICD-10-CM

## 2014-05-07 DIAGNOSIS — E876 Hypokalemia: Secondary | ICD-10-CM

## 2014-05-07 LAB — HEPARIN LEVEL (UNFRACTIONATED)
HEPARIN UNFRACTIONATED: 0.37 [IU]/mL (ref 0.30–0.70)
Heparin Unfractionated: 0.37 IU/mL (ref 0.30–0.70)

## 2014-05-07 LAB — GLUCOSE, CAPILLARY
GLUCOSE-CAPILLARY: 214 mg/dL — AB (ref 70–99)
GLUCOSE-CAPILLARY: 171 mg/dL — AB (ref 70–99)
GLUCOSE-CAPILLARY: 132 mg/dL — AB (ref 70–99)
GLUCOSE-CAPILLARY: 171 mg/dL — AB (ref 70–99)
GLUCOSE-CAPILLARY: 176 mg/dL — AB (ref 70–99)
GLUCOSE-CAPILLARY: 173 mg/dL — AB (ref 70–99)
Glucose-Capillary: 167 mg/dL — ABNORMAL HIGH (ref 70–99)
Glucose-Capillary: 173 mg/dL — ABNORMAL HIGH (ref 70–99)
Glucose-Capillary: 206 mg/dL — ABNORMAL HIGH (ref 70–99)
Glucose-Capillary: 225 mg/dL — ABNORMAL HIGH (ref 70–99)
Glucose-Capillary: 186 mg/dL — ABNORMAL HIGH (ref 70–99)
Glucose-Capillary: 146 mg/dL — ABNORMAL HIGH (ref 70–99)
Glucose-Capillary: 159 mg/dL — ABNORMAL HIGH (ref 70–99)
Glucose-Capillary: 127 mg/dL — ABNORMAL HIGH (ref 70–99)

## 2014-05-07 LAB — BASIC METABOLIC PANEL
ANION GAP: 11 (ref 5–15)
ANION GAP: 12 (ref 5–15)
ANION GAP: 14 (ref 5–15)
ANION GAP: 13 (ref 5–15)
ANION GAP: 14 (ref 5–15)
Anion gap: 11 (ref 5–15)
BUN: 16 mg/dL (ref 6–23)
BUN: 17 mg/dL (ref 6–23)
BUN: 17 mg/dL (ref 6–23)
BUN: 19 mg/dL (ref 6–23)
BUN: 20 mg/dL (ref 6–23)
BUN: 21 mg/dL (ref 6–23)
CALCIUM: 7.2 mg/dL — AB (ref 8.4–10.5)
CHLORIDE: 102 meq/L (ref 96–112)
CO2: 21 mEq/L (ref 19–32)
CO2: 21 meq/L (ref 19–32)
CO2: 22 meq/L (ref 19–32)
CO2: 21 mEq/L (ref 19–32)
CO2: 20 meq/L (ref 19–32)
CO2: 21 mEq/L (ref 19–32)
CREATININE: 0.96 mg/dL (ref 0.50–1.35)
CREATININE: 1.05 mg/dL (ref 0.50–1.35)
Calcium: 7.4 mg/dL — ABNORMAL LOW (ref 8.4–10.5)
Calcium: 7.5 mg/dL — ABNORMAL LOW (ref 8.4–10.5)
Calcium: 7.4 mg/dL — ABNORMAL LOW (ref 8.4–10.5)
Calcium: 7.6 mg/dL — ABNORMAL LOW (ref 8.4–10.5)
Calcium: 7.3 mg/dL — ABNORMAL LOW (ref 8.4–10.5)
Chloride: 99 mEq/L (ref 96–112)
Chloride: 102 mEq/L (ref 96–112)
Chloride: 104 mEq/L (ref 96–112)
Chloride: 102 mEq/L (ref 96–112)
Chloride: 104 mEq/L (ref 96–112)
Creatinine, Ser: 0.92 mg/dL (ref 0.50–1.35)
Creatinine, Ser: 0.87 mg/dL (ref 0.50–1.35)
Creatinine, Ser: 0.87 mg/dL (ref 0.50–1.35)
Creatinine, Ser: 0.88 mg/dL (ref 0.50–1.35)
GFR calc Af Amer: >90 mL/min (ref >90)
GFR calc Af Amer: >90 mL/min (ref >90)
GFR calc non Af Amer: 85 mL/min — ABNORMAL LOW (ref >90)
GFR calc non Af Amer: 87 mL/min — ABNORMAL LOW (ref >90)
GFR calc non Af Amer: 71 mL/min — ABNORMAL LOW (ref >90)
GFR calc non Af Amer: 83 mL/min — ABNORMAL LOW (ref >90)
GFR, EST AFRICAN AMERICAN: 82 mL/min — AB (ref >90)
GFR, EST NON AFRICAN AMERICAN: 87 mL/min — AB (ref >90)
GFR, EST NON AFRICAN AMERICAN: 86 mL/min — AB (ref >90)
GLUCOSE: 169 mg/dL — AB (ref 70–99)
Glucose, Bld: 195 mg/dL — ABNORMAL HIGH (ref 70–99)
Glucose, Bld: 184 mg/dL — ABNORMAL HIGH (ref 70–99)
Glucose, Bld: 161 mg/dL — ABNORMAL HIGH (ref 70–99)
Glucose, Bld: 124 mg/dL — ABNORMAL HIGH (ref 70–99)
Glucose, Bld: 126 mg/dL — ABNORMAL HIGH (ref 70–99)
POTASSIUM: 5.5 meq/L — AB (ref 3.7–5.3)
POTASSIUM: 3.9 meq/L (ref 3.7–5.3)
POTASSIUM: 4.1 meq/L (ref 3.7–5.3)
Potassium: 3.5 mEq/L — ABNORMAL LOW (ref 3.7–5.3)
Potassium: 4.2 mEq/L (ref 3.7–5.3)
Potassium: 3.9 mEq/L (ref 3.7–5.3)
SODIUM: 134 meq/L — AB (ref 137–147)
SODIUM: 135 meq/L — AB (ref 137–147)
SODIUM: 137 meq/L (ref 137–147)
SODIUM: 134 meq/L — AB (ref 137–147)
Sodium: 136 mEq/L — ABNORMAL LOW (ref 137–147)
Sodium: 138 mEq/L (ref 137–147)

## 2014-05-07 LAB — POCT I-STAT, CHEM 8
BUN: 16 mg/dL (ref 6–23)
BUN: 16 mg/dL (ref 6–23)
CALCIUM ION: 1.03 mmol/L — AB (ref 1.13–1.30)
CHLORIDE: 101 meq/L (ref 96–112)
CHLORIDE: 99 meq/L (ref 96–112)
Calcium, Ion: 1.06 mmol/L — ABNORMAL LOW (ref 1.13–1.30)
Creatinine, Ser: 0.9 mg/dL (ref 0.50–1.35)
Creatinine, Ser: 0.9 mg/dL (ref 0.50–1.35)
Glucose, Bld: 202 mg/dL — ABNORMAL HIGH (ref 70–99)
Glucose, Bld: 164 mg/dL — ABNORMAL HIGH (ref 70–99)
HCT: 45 % (ref 39.0–52.0)
HEMATOCRIT: 48 % (ref 39.0–52.0)
Hemoglobin: 16.3 g/dL (ref 13.0–17.0)
Hemoglobin: 15.3 g/dL (ref 13.0–17.0)
POTASSIUM: 3.9 meq/L (ref 3.7–5.3)
Potassium: 3.2 mEq/L — ABNORMAL LOW (ref 3.7–5.3)
SODIUM: 138 meq/L (ref 137–147)
Sodium: 137 mEq/L (ref 137–147)
TCO2: 21 mmol/L (ref 0–100)
TCO2: 20 mmol/L (ref 0–100)

## 2014-05-07 LAB — CBC
HEMATOCRIT: 41 % (ref 39.0–52.0)
HEMOGLOBIN: 14.6 g/dL (ref 13.0–17.0)
MCH: 34.6 pg — ABNORMAL HIGH (ref 26.0–34.0)
MCHC: 35.6 g/dL (ref 30.0–36.0)
MCV: 97.2 fL (ref 78.0–100.0)
Platelets: 125 10*3/uL — ABNORMAL LOW (ref 150–400)
RBC: 4.22 MIL/uL (ref 4.22–5.81)
RDW: 12.5 % (ref 11.5–15.5)
WBC: 14 10*3/uL — AB (ref 4.0–10.5)

## 2014-05-07 LAB — APTT: aPTT: >200 seconds (ref 24–37)

## 2014-05-07 LAB — PROTIME-INR
INR: 1.59 — ABNORMAL HIGH (ref 0.00–1.49)
Prothrombin Time: 19 seconds — ABNORMAL HIGH (ref 11.6–15.2)

## 2014-05-07 MED ORDER — INSULIN ASPART 100 UNIT/ML ~~LOC~~ SOLN
2.0000 [IU] | SUBCUTANEOUS | Status: DC
  Administered 2014-05-07 (×2): 2 [IU] via SUBCUTANEOUS

## 2014-05-07 MED ORDER — POTASSIUM CHLORIDE 10 MEQ/50ML IV SOLN
10.0000 meq | INTRAVENOUS | Status: DC
  Administered 2014-05-07 (×3): 10 meq via INTRAVENOUS
  Filled 2014-05-07 (×2): qty 50

## 2014-05-07 MED ORDER — INSULIN GLARGINE 100 UNIT/ML ~~LOC~~ SOLN
10.0000 [IU] | SUBCUTANEOUS | Status: DC
  Administered 2014-05-07: 10 [IU] via SUBCUTANEOUS
  Filled 2014-05-07: qty 0.1

## 2014-05-07 MED ORDER — POTASSIUM CHLORIDE 10 MEQ/50ML IV SOLN
10.0000 meq | INTRAVENOUS | Status: DC
  Administered 2014-05-07 (×2): 10 meq via INTRAVENOUS
  Filled 2014-05-07 (×3): qty 50

## 2014-05-07 MED FILL — Medication: Qty: 1 | Status: AC

## 2014-05-15 NOTE — Progress Notes (Signed)
[facility]  Discussed CT head results with wife.  Explained he will not recover from this process. They wish to withdraw care, but will likely wait until 8/25 AM when all family here.  Begin rewarming now  Roselie Awkward, MD [facility] Pager: [phone] Cell: [phone] If no response, call [phone]

## 2014-05-15 NOTE — Progress Notes (Signed)
CRITICAL VALUE ALERT  Critical value received:  PTT >200  Date of notification:  05/17/2014  Time of notification:  0110  Critical value read back:Yes.    Nurse who received alert:  [place] Gen Clagg, RN  MD notified (1st page):  Dr. Levonne Spiller   Time of first page:  0112  MD notified (2nd page):   Time of second page:  Responding MD: Dr. Levonne Spiller  Time MD responded:  Aden.Burow  MD recommended notifying pharmacy of value. Spoke with Jeneen Rinks in pharmacy who recommended continuation of treatment and to draw a heparin level to verify dosage.

## 2014-05-15 NOTE — Progress Notes (Signed)
Rewarming began at 1620 per MD order.  Two RN's present when rewarming initiated.  Potassium chloride stopped. Orrin Brigham, RN

## 2014-05-15 NOTE — Progress Notes (Signed)
Pt and family given comfort care and emotional support. Clergy offered but refused at this time per pt's wife. Comfort obtained cart.

## 2014-05-15 NOTE — Care Management Note (Signed)
    Page 1 of 1   05-19-14     11:44:27 AM CARE MANAGEMENT NOTE 05-19-14  Patient:  [name], [name]   Account Number:  [id number]  Date Initiated:  19-May-2014  Documentation initiated by:  Elissa Hefty  Subjective/Objective Assessment:   adm w cardiac arrest-vent     Action/Plan:   lives w wife   Anticipated DC Date:     Anticipated DC Plan:           Choice offered to / List presented to:             Status of service:   Medicare Important Message given?   (If response is "NO", the following Medicare IM given date fields will be blank) Date Medicare IM given:   Medicare IM given by:   Date Additional Medicare IM given:   Additional Medicare IM given by:    Discharge Disposition:    Per UR Regulation:  Reviewed for med. necessity/level of care/duration of stay  If discussed at [place] of Stay Meetings, dates discussed:    Comments:

## 2014-05-15 NOTE — Progress Notes (Signed)
Chaplain responded to page for CPR in progress. Pulse was restored. Provided ministry of presence and empathic listening for pt's wife and two sons who are deeply concerned because pt was not breathing when he was found. Facilitated communication between staff and family. Accompanied family to Trauma A to be at bedside. Pt taken to 2H02.

## 2014-05-15 NOTE — Procedures (Signed)
Extubation Procedure Note  Patient Details:   Name: [name] DOB: 1946/06/05 MRN: [id number]   Airway Documentation:  Airway 7.5 mm (Active)  Secured at (cm) 23 cm 08-May-2014  3:38 PM  Measured From Lips May 08, 2014  3:38 PM  Secured Location Left 05-08-2014  3:38 PM  Secured By [employer] 05/08/14  3:38 PM  Tube Holder Repositioned Yes 05-08-2014  3:38 PM  Cuff Pressure (cm H2O) 25 cm H2O 2014-05-08  3:38 PM  Site Condition Dry 05-08-14  3:38 PM    Evaluation  O2 sats: currently acceptable Complications: No apparent complications Patient did tolerate procedure well. Bilateral Breath Sounds: Clear;Diminished Suctioning: Airway No  [name] 05/08/2014, 9:08 PM

## 2014-05-15 NOTE — Progress Notes (Signed)
Withdrawal care, terminal extubation. RN at bedside, pt suctioned before and after. Family at bedside.

## 2014-05-15 NOTE — Progress Notes (Signed)
PULMONARY / CRITICAL CARE MEDICINE   Name: [name] MRN: [id number] DOB: 07-06-1946    ADMISSION DATE:  04/21/2014  CHIEF COMPLAINT:  Cardiac Arrest  INITIAL PRESENTATION:  68 year old man found unresponsive 8/23, initial rhythm asystole. [place] after approximately 35 minutes. Telemetry sinus bradycardia with right bundle branch block. Initial neurological exam GCS 3. Hyperthermia initiated 8/23.   STUDIES:  Head CT 8/23 >> no intracranial abnormality C-spine CT 8/23>> no evidence for cervical spine fracture or dislocation Abdominal x-ray 8/23>> normal bowel gas pattern Chest x-ray 8/23 >> is significant infiltrates, ETT, CVC in good position Head CT 8/24 >>  SIGNIFICANT EVENTS:   SUBJECTIVE: left pupil blown this morning, more hypertensive  VITAL SIGNS: Temp:  [90.5 F (32.5 C)-92.8 F (33.8 C)] 91.9 F (33.3 C) (08/24 1000) Pulse Rate:  [48-77] 61 (08/24 1000) Resp:  [16-23] 20 (08/24 1000) BP: (91-216)/(48-94) 132/69 mmHg (08/24 1000) SpO2:  [93 %-100 %] 100 % (08/24 1000) Arterial Line BP: (123-176)/(53-72) 142/67 mmHg (08/24 1000) FiO2 (%):  [50 %-100 %] 60 % (08/24 0800) Weight:  [88.7 kg (195 lb 8.8 oz)-91.128 kg (200 lb 14.4 oz)] 88.7 kg (195 lb 8.8 oz) (08/23 1925) HEMODYNAMICS: CVP:  [4 mmHg-12 mmHg] 12 mmHg VENTILATOR SETTINGS: Vent Mode:  [-] PRVC FiO2 (%):  [50 %-100 %] 60 % Set Rate:  [16 bmp-20 bmp] 20 bmp Vt Set:  [500 mL-600 mL] 500 mL PEEP:  [5 cmH20] 5 cmH20 Plateau Pressure:  [17 cmH20-19 cmH20] 17 cmH20 INTAKE / OUTPUT:  Intake/Output Summary (Last 24 hours) at 05/29/14 1057 Last data filed at 05/29/14 1000  Gross per 24 hour  Intake 3935.81 ml  Output   3487 ml  Net 448.81 ml    PHYSICAL EXAMINATION:  Gen; sedated, paralyzed on vent HEENT: NCAT, EOMi, ETT, soft collar, left pupil dilated, non-reactive PULM: CTA B CV: cannot hear due to pads AB: BS infrequent, soft, limited by pads Ext: cool, no edema Neuro; paralyzed,  sedated  LABS:  CBC  Recent Labs Lab 04/30/2014 1531  05/29/2014 0044 05/29/14 0300 2014-05-29 0303  WBC 13.1*  --   --   --  14.0*  HGB 15.4  < > 16.3 15.3 14.6  HCT 44.4  < > 48.0 45.0 41.0  PLT 128*  --   --   --  125*  < > = values in this interval not displayed. Coag's  Recent Labs Lab 04/23/2014 1531 05/05/2014 2355 05/29/14 0303  APTT 64* >200* >200*  INR 1.29  --  1.59*   BMET  Recent Labs Lab 2014-05-29 0500 2014/05/29 0615 05-29-2014 1013  NA 134* 135* 137  K 5.5* 3.9 3.5*  CL 102 102 104  CO2 21 22 21   BUN 17 17 19   CREATININE 0.87 0.87 0.88  GLUCOSE 169* 184* 161*   Electrolytes  Recent Labs Lab 05/03/2014 1531  05-29-14 0500 05/29/2014 0615 05-29-2014 1013  CALCIUM 9.2  < > 7.4* 7.5* 7.4*  MG 2.3  --   --   --   --   < > = values in this interval not displayed. Sepsis Markers  Recent Labs Lab 04/25/2014 1541  LATICACIDVEN 10.45*   ABG  Recent Labs Lab 04/18/2014 1638 05/04/2014 2058  PHART 7.114* 7.408  PCO2ART 58.3* 33.6*  PO2ART 114.0* 58.0*   Liver Enzymes  Recent Labs Lab 04/30/2014 1531  AST 388*  ALT 163*  ALKPHOS 101  BILITOT 0.4  ALBUMIN 3.0*   Cardiac Enzymes  Recent Labs Lab 04/27/2014 1531  TROPONINI <0.30  PROBNP 589.1*   Glucose  Recent Labs Lab 2014/06/02 0352 06-02-2014 0456 06-02-2014 0602 06/02/2014 0724 June 02, 2014 0827 2014-06-02 0931  GLUCAP 167* 173* 171* 176* 186* 173*    Imaging Ct Head Wo Contrast  05/11/2014   CLINICAL DATA:  Acute mental status change.  EXAM: CT HEAD WITHOUT CONTRAST  CT CERVICAL SPINE WITHOUT CONTRAST  TECHNIQUE: Multidetector CT imaging of the head and cervical spine was performed following the standard protocol without intravenous contrast. Multiplanar CT image reconstructions of the cervical spine were also generated.  COMPARISON:  None.  FINDINGS: CT HEAD FINDINGS  The patient is intubated. No acute cortical infarct, hemorrhage, or mass lesion ispresent. Ventricles are of normal size. No  significant extra-axial fluid collection is present. The paranasal sinuses andmastoid air cells are clear. The osseous skull is intact.  CT CERVICAL SPINE FINDINGS  Normal alignment of the cervical spine. The vertebral body heights are well preserved. There is mild multi level disc space narrowing and ventral endplate spurring. This is most advanced at C6-7 and C7-T1. The facet joints are aligned. There is bilateral facet hypertrophy and degenerative change. The prevertebral soft tissue space appears within normal limits. Calcified atherosclerotic disease involves the carotid arteries.  IMPRESSION: 1. No acute intracranial abnormalities. 2. No evidence for cervical spine fracture or dislocation 3. Cervical degenerative disc disease 4. Atherosclerotic disease.   Electronically Signed   By: Kerby Moors M.D.   On: 05/11/2014 16:40   Ct Cervical Spine Wo Contrast  05/11/2014   CLINICAL DATA:  Acute mental status change.  EXAM: CT HEAD WITHOUT CONTRAST  CT CERVICAL SPINE WITHOUT CONTRAST  TECHNIQUE: Multidetector CT imaging of the head and cervical spine was performed following the standard protocol without intravenous contrast. Multiplanar CT image reconstructions of the cervical spine were also generated.  COMPARISON:  None.  FINDINGS: CT HEAD FINDINGS  The patient is intubated. No acute cortical infarct, hemorrhage, or mass lesion ispresent. Ventricles are of normal size. No significant extra-axial fluid collection is present. The paranasal sinuses andmastoid air cells are clear. The osseous skull is intact.  CT CERVICAL SPINE FINDINGS  Normal alignment of the cervical spine. The vertebral body heights are well preserved. There is mild multi level disc space narrowing and ventral endplate spurring. This is most advanced at C6-7 and C7-T1. The facet joints are aligned. There is bilateral facet hypertrophy and degenerative change. The prevertebral soft tissue space appears within normal limits. Calcified  atherosclerotic disease involves the carotid arteries.  IMPRESSION: 1. No acute intracranial abnormalities. 2. No evidence for cervical spine fracture or dislocation 3. Cervical degenerative disc disease 4. Atherosclerotic disease.   Electronically Signed   By: Kerby Moors M.D.   On: 04/18/2014 16:40   Dg Chest Portable 1 View  05/05/2014   CLINICAL DATA:  Cardiac arrest.  Central line placement.  EXAM: PORTABLE CHEST - 1 VIEW  COMPARISON:  05/10/2014.  FINDINGS: 1733 hr. Tip of the endotracheal tube is in the mid trachea. A nasogastric tube projects just below the diaphragm, although its side port is in the distal esophagus. Right subclavian central venous catheter extends to the mid SVC level. The heart size is stable. There is interval improved aeration of both lung bases. No pneumothorax or significant pleural effusion is seen. Telemetry leads and an external pacer overlie the chest. No fractures are seen.  IMPRESSION: Support system positioning as described. The nasogastric tube projects into the proximal stomach. Interval improved aeration of both lung bases.  Electronically Signed   By: Camie Patience M.D.   On: 04/27/2014 17:54   Dg Chest Port 1 View  05/01/2014   CLINICAL DATA:  Cardiac arrest  EXAM: PORTABLE CHEST - 1 VIEW  COMPARISON:  None.  FINDINGS: Endotracheal tube tip is 4.6 cm above the carina. Nasogastric tube tip is in the stomach with the side port at the gastroesophageal junction. No pneumothorax. There is left lower lobe consolidation. Lungs are otherwise clear. Heart is prominent with pulmonary vascularity within normal limits. No adenopathy. There is atherosclerotic change in aorta. No bone lesions.  IMPRESSION: Tube positions as described without pneumothorax. Note that the nasogastric tube side port is at the level of the gastroesophageal junction. It may be prudent to consider advancing nasogastric tube 6 to 8 cm. Left lower lobe consolidation. Lungs elsewhere clear.    Electronically Signed   By: Lowella Grip M.D.   On: 04/25/2014 16:12   Dg Abd Portable 1v  04/17/2014   CLINICAL DATA:  Nasogastric tube placement  EXAM: PORTABLE ABDOMEN - 1 VIEW  COMPARISON:  None.  FINDINGS: Nasogastric tube tip is in the stomach with the side port at the gastroesophageal junction. Bowel gas pattern is overall unremarkable. No free air is seen on this supine examination.  IMPRESSION: Nasogastric tube tip in proximal stomach with side port at the gastroesophageal junction. Consider advancing tube 6 to 8 cm. Bowel gas pattern overall unremarkable.   Electronically Signed   By: Lowella Grip M.D.   On: 04/24/2014 16:12     ASSESSMENT / PLAN:  PULMONARY OETT 8/23 >>  A:  Acute respiratory failure Heavy tobacco abuse and presumed COPD P:   - Continue full vent support - ABG in AM - Scheduled bronchodilators  CARDIOVASCULAR CVL right subclavian 8/23>>  A:  Cardiac arrest, etiology unclear. PE? Cardiogenic shock Bradycardia  P:  - echocardiogram - repeat 12 lead EKG - continue heparin drip for now, needs CT chest after rewarmed - Norepinephrine for blood pressure support  RENAL A:   Acute renal failure Hypokalemia P:   - monitor BMET and UOP  GASTROINTESTINAL A:   Acid prophylaxis P:   - PPI  HEMATOLOGIC A:   Non-Hodgkin's lymphoma. Per the patient's wife his most recent PET scan has shown recurrence. He has been hesitant to restart chemotherapy and has indicated that he would not want to take chemotherapy if it would negatively impact his quality of life.  DVT prophylaxis, possible pulmonary embolism P:  - heparin drip initiated as above  INFECTIOUS A:  No evidence for active infection P:   Culture if febrile  ENDOCRINE A:  DM2   P:   - switch from insulin gtt to sub-cutaneous today  NEUROLOGIC A:  Probable hypoxic neurologic injury [place] on 8/23 P:   RASS goal: -2 -stat head CT -continue arctic sun protocol  (fentanyl/versed/nimbex)  GLOBAL Code status >> DNR  Family updated at bedside on 8/24.  TODAY'S SUMMARY:   I have personally obtained a history, examined the patient, evaluated laboratory and imaging results, formulated the assessment and plan and placed orders. CRITICAL CARE: The patient is critically ill with multiple organ systems failure and requires high complexity decision making for assessment and support, frequent evaluation and titration of therapies, application of advanced monitoring technologies and extensive interpretation of multiple databases. Critical Care Time devoted to patient care services described in this note is 40 minutes.   Roselie Awkward, MD [facility] Pager: [phone] Cell: [phone] If no response,  call [phone]

## 2014-05-15 NOTE — Progress Notes (Signed)
[facility] Progress Note Patient Name: [name] DOB: 09/20/1945 MRN: [id number]   Date of Service  May 25, 2014  HPI/Events of Note  Nurses report fm all present and requesting removal of life support as agreed with Dr Spero Curb, nimbex off for over an hour  eICU Interventions  Withdrawal protocol initiated, chaplain requested     Intervention Category Major Interventions: End of life / care limitation discussion  Christinia Gully 05-25-2014, 8:50 PM

## 2014-05-15 NOTE — Progress Notes (Addendum)
Time of death 2137. Verified by Julien Girt, RN and Purcell Nails, RN. Family at bedside.

## 2014-05-15 NOTE — Progress Notes (Signed)
ANTICOAGULATION CONSULT NOTE - Follow Up Consult  Pharmacy Consult for Heparin Indication: rule out PE  No Known Allergies  Patient Measurements: Height: 5\' 9"  (175.3 cm) Weight: 195 lb 8.8 oz (88.7 kg) IBW/kg (Calculated) : 70.7 Heparin Dosing Weight: 88.7kg  Vital Signs: Temp: 91.6 F (33.1 C) (08/24 1100) Temp src: Core (Comment) (08/24 0800) BP: 156/79 mmHg (08/24 1145) Pulse Rate: 71 (08/24 1145)  Labs:  Recent Labs  04/20/2014 1531  05/06/2014 2355 05/11/14 0044 2014/05/11 0245 11-May-2014 0300 05/11/14 0303 2014-05-11 0500 05-11-14 0615 05-11-2014 0927 2014-05-11 1013  HGB 15.4  < >  --  16.3  --  15.3 14.6  --   --   --   --   HCT 44.4  < >  --  48.0  --  45.0 41.0  --   --   --   --   PLT 128*  --   --   --   --   --  125*  --   --   --   --   APTT 64*  --  >200*  --   --   --  >200*  --   --   --   --   LABPROT 16.1*  --   --   --   --   --  19.0*  --   --   --   --   INR 1.29  --   --   --   --   --  1.59*  --   --   --   --   HEPARINUNFRC  --   --   --   --  0.37  --   --   --   --  0.37  --   CREATININE 1.35  < >  --  0.92  0.90  --  0.90  --  0.87 0.87  --  0.88  TROPONINI <0.30  --   --   --   --   --   --   --   --   --   --   < > = values in this interval not displayed.  Estimated Creatinine Clearance: 88.5 ml/min (by C-G formula based on Cr of 0.88).   Medications:  Heparin @ 850 units/hr  Assessment: 68yom s/p cardiac arrest was started on heparin for possible PE. Confirmatory heparin level is therapeutic. Re-warming to begin at [facility], so anticipate he may require higher rates at that point. CBC is stable. No bleeding reported.  Goal of Therapy:  Heparin level 0.3-0.7 units/ml Monitor platelets by anticoagulation protocol: Yes   Plan:  1) Continue heparin at 850 units/hr 2) Check heparin level at 2330 tonight (~4 hours after re-warming begins)  Deboraha Sprang May 11, 2014,12:05 PM

## 2014-05-15 NOTE — Progress Notes (Signed)
[facility]  Change in BP, HR suddenly dropped, left pupil blown  Stat CT head  Roselie Awkward, MD [facility] Pager: [phone] Cell: [phone] If no response, call [phone]

## 2014-05-15 NOTE — ED Provider Notes (Signed)
This patient was seen in conjunction with the resident physician. The documentation accurately reflects the patient's emergency department evaluation. On my exam, patient was presenting  in extremis via EMS after having being found down by family members, with initial rhythm of asystole, according to EMS. Patient had return of spontaneous circulation on arrival to the emergency department, and patient had a sinus rhythm, with hypertension. Initially the patient became hypotensive, requiring initiation of epinephrine. Patient remained neurologically impaired throughout his emergency department course, with flaccid extremities, no tracking visually, and no oral pharyngeal tone when we replaced his airway.  After the initial resuscitation, and initiation of cooling, we discussed the patient's case with our critical care team. Though the history of asystole on initial EMS arrival, an unknown down.  Since then consistent with a return to spontaneous regulation, with hypertension, cooling was initiated. I supervised, assisting with the intubation, and central line placement performed by the resident.  EKG post arrest showed a ventricular rate of 82, with prolonged PR interval, intraventricular conduction delay, abnormal  After several hours of monitoring in the emergency department, patient was admitted to the critical care team.  CRITICAL CARE Performed by: Carmin Muskrat Total critical care time: 40 Critical care time was exclusive of separately billable procedures and treating other patients. Critical care was necessary to treat or prevent imminent or life-threatening deterioration. Critical care was time spent personally by me on the following activities: development of treatment plan with patient and/or surrogate as well as nursing, discussions with consultants, evaluation of patient's response to treatment, examination of patient, obtaining history from patient or surrogate, ordering and  performing treatments and interventions, ordering and review of laboratory studies, ordering and review of radiographic studies, pulse oximetry and re-evaluation of patient's condition.    Carmin Muskrat, MD 06-03-2014 [phone]

## 2014-05-15 NOTE — Discharge Summary (Signed)
[name], [name]                ACCOUNT NO.:  [id number]  MEDICAL RECORD NO.:  [id number]  LOCATION:  2H02C                        FACILITY:  [facility]  PHYSICIAN:  Norlene Campbell, MDDATE OF BIRTH:  [birth year]  DATE OF ADMISSION:  04/27/2014 DATE OF DISCHARGE:  May 19, 2014                              DISCHARGE SUMMARY   DEATH SUMMARY  FINAL DIAGNOSES: 1. Cardiac arrest, unclear etiology. 2. Lymphoma. 3. Severe anoxic brain injury.  HISTORY OF PRESENT ILLNESS:  This is a 68 year old man with a history of recurrent non-Hodgkin lymphoma, alcoholism, and tobacco abuse who was found unresponsive by his family at home.  He had been seen approximately 1 hour beforehand.  At that time, he was down, he was completely unresponsive and cyanotic.  He received 10 minutes of bystander CPR before EMS arrived.  The first rhythm documented was asystole.  He regained spontaneous rhythm but then lost again en route.  He had PEA arrest in the [facility] ER and received epinephrine.  He then developed sinus brady with a right bundle-branch block.  His estimated downtime was at minimum 35 minutes.  He was treated with norepinephrine and was transferred to the [facility] with intention to treat with induced hyperthermia.  PAST MEDICAL HISTORY:  Please see the admission H and P.  FAMILY HISTORY:  Please see the admission H and P.  SOCIAL HISTORY:  Please see the admission H and P.  PHYSICAL EXAMINATION:  VITAL SIGNS:  Temperature on admission 91.8, heart rate 51, respirations 18, blood pressure 153/61, SpO2 is 100% on the ventilator. GENERAL:  Pale appearing, intubated, and unresponsive to pain  right pupil 3 mm, left pupil 4 mm.  HEENT:  ETT in place. CARDIOVASCULAR:  Loletha Grayer, regular. LUNGS:  Clear to auscultation bilaterally. ABDOMEN:  Slightly distended.  Positive bowel sounds. MUSCULOSKELETAL:  No deformities or edema. SKIN:  No rash.  Cool.  HOSPITAL COURSE:   The patient was admitted to the [facility] Cardiac [facility] for further management of cardiac arrest.  There was never a clear etiology of the cardiac arrest identified but we presumably treated him for pulmonary embolism given his history of malignancy.  We were never able to get into a point where he could be tested for this.  He was cooled for 24 hours and then after rewarming had evidence of high degree of brain injury.  Specifically, he had worsening pupillary changes on exam.  On May 19, 2014, on the early afternoon, he had no gag reflex, no pupillary light response.  CT head was performed which showed evidence of severe anoxic brain injury.  We discussed this with the patient's family and they elected to withdraw care at that point.  He died peacefully with his family at the bedside.          ______________________________ Norlene Campbell, MD     DBM/MEDQ  D:  05/14/2014  T:  05/15/2014  Job:  [id number]

## 2014-05-15 NOTE — Progress Notes (Signed)
INITIAL NUTRITION ASSESSMENT  DOCUMENTATION CODES Per approved criteria  -Not Applicable   INTERVENTION: Once hypothermia protocol is completes, recommend initiation of Vital HP @ 20 ml/hr via OGT and increase by 10 ml every 6 hours to goal rate of 65 ml/hr. Propofol providing additional 288 kcal per day.   Tube feeding regimen with propofol provides 1848 kcal (96% of needs), 136 grams of protein, and 1310 ml of H2O.   RD will continue to monitor for nutrition care plan.  NUTRITION DIAGNOSIS: Inadequate oral intake related to inability to eat as evidenced by NPO.   Goal: Pt to meet >/= 90% of their estimated nutrition needs   Monitor:  Weight trend, TF initiation and toleration, labs, I/O's, vent status, propofol infusion  Reason for Assessment: Vent  68 y.o. male  Admitting Dx: Cardiac arrest  ASSESSMENT: 68 year old man found unresponsive 8/23, initial rhythm asystole. [place] after approximately 35 minutes. Telemetry sinus bradycardia with right bundle branch block. Initial neurological exam GCS 3.   Hyperthermia initiated 8/23.   Patient is currently intubated on ventilator support MV: 12.1 L/min Temp (24hrs), Avg:91.4 F (33 C), Min:90.5 F (32.5 C), Max:92.8 F (33.8 C)  Propofol: 10.9 ml/hr  - Pt with no signs of fat or muscle wasting.  Labs: Na low K WNL CBGs: 171-186  Height: Ht Readings from Last 1 Encounters:  No data found for Ht    Weight: Wt Readings from Last 1 Encounters:  05/10/2014 195 lb 8.8 oz (88.7 kg)    Ideal Body Weight: 70.7 kg  % Ideal Body Weight: 125%  Wt Readings from Last 10 Encounters:  05/14/2014 195 lb 8.8 oz (88.7 kg)    Usual Body Weight: unknown  % Usual Body Weight: n/a  BMI:  Body mass index is 28.86 kg/(m^2).  Estimated Nutritional Needs: Kcal: 1923 Protein: 120-135 g Fluid: Per MD  Skin: intact  Diet Order:    EDUCATION NEEDS: -Education not appropriate at this time   Intake/Output Summary (Last 24  hours) at May 11, 2014 1015 Last data filed at 05/11/2014 1000  Gross per 24 hour  Intake 3935.81 ml  Output   3487 ml  Net 448.81 ml    Last BM: prior to admission   Labs:   Recent Labs Lab 04/24/2014 1531  May 11, 2014 0044 2014/05/11 0300 May 11, 2014 0500 May 11, 2014 0615  NA 129*  < > 134*  137 138 134* 135*  K 3.7  < > 4.1  3.9 3.2* 5.5* 3.9  CL 87*  < > 99  99 101 102 102  CO2 20  < > 21  --  21 22  BUN 13  < > 16  16 16 17 17   CREATININE 1.35  < > 0.92  0.90 0.90 0.87 0.87  CALCIUM 9.2  < > 7.2*  --  7.4* 7.5*  MG 2.3  --   --   --   --   --   GLUCOSE 464*  < > 195*  202* 164* 169* 184*  < > = values in this interval not displayed.  CBG (last 3)   Recent Labs  May 11, 2014 0724 2014-05-11 0827 11-May-2014 0931  GLUCAP 176* 186* 173*    Scheduled Meds: . antiseptic oral rinse  7 mL Mouth Rinse QID  . artificial tears  1 application Both Eyes 3 times per day  . aspirin  324 mg Oral Once  . aspirin  300 mg Rectal NOW  . chlorhexidine  15 mL Mouth Rinse BID  . insulin aspart  2-6 Units Subcutaneous 6 times per day  . insulin glargine  10 Units Subcutaneous Q24H  . ipratropium-albuterol  3 mL Nebulization Q6H  . pantoprazole (PROTONIX) IV  40 mg Intravenous Q24H    Continuous Infusions: . cisatracurium (NIMBEX) infusion 1 mcg/kg/min (04/19/2014 2000)  . fentaNYL infusion INTRAVENOUS 125 mcg/hr (05/11/14 0300)  . heparin 850 Units/hr (04/17/2014 2041)  . insulin (NOVOLIN-R) infusion 2.3 Units/hr (05/11/2014 0931)  . norepinephrine (LEVOPHED) Adult infusion 5 mcg/min (04/19/2014 1608)  . norepinephrine (LEVOPHED) Adult infusion Stopped (05/11/2014 0927)  . propofol 20 mcg/kg/min (05-11-14 6734)    Past Medical History  Diagnosis Date  . Non Hodgkin's lymphoma 2008  . Diabetes mellitus without complication     No longer on medications after weight loss  . Hypertension   . Hepatitis C     Never treated  . Alcoholism   . Tobacco abuse     Past Surgical History  Procedure  Laterality Date  . Deep neck lymph node biopsy / excision Left 2006    Terrace Arabia RD, LDN

## 2014-05-15 NOTE — Progress Notes (Signed)
ANTICOAGULATION CONSULT NOTE - Follow Up Consult  Pharmacy Consult for heparin Indication: pulmonary embolus, on hypothermia protocol  Labs:  Recent Labs  05/08/2014 1531  05/14/2014 2124 05/01/2014 2137 05/13/2014 2318 05/12/2014 2355 May 30, 2014 0044 05/30/14 0245  HGB 15.4  < > 16.3  --  16.3  --  16.3  --   HCT 44.4  < > 48.0  --  48.0  --  48.0  --   PLT 128*  --   --   --   --   --   --   --   APTT 64*  --   --   --   --  >200*  --   --   LABPROT 16.1*  --   --   --   --   --   --   --   INR 1.29  --   --   --   --   --   --   --   HEPARINUNFRC  --   --   --   --   --   --   --  0.37  CREATININE 1.35  < > 1.00 0.97 1.10  --  0.92  0.90  --   TROPONINI <0.30  --   --   --   --   --   --   --   < > = values in this interval not displayed.   Assessment/Plan:  69yo male therapeutic on heparin with initial dosing for PE, now s/p cardiac arrest and cooled on hypothermia protocol. Will continue gtt at current rate and confirm stable with additional level.   Wynona Neat, PharmD, BCPS  30-May-2014,3:39 AM

## 2014-05-15 NOTE — Progress Notes (Addendum)
Wasted 120 mL of fentanyl 10 mcg/mL in sink. Witnessed by Julien Girt, RN and Rosebud Poles, RN.

## 2014-05-15 DEATH — deceased

## 2015-03-09 IMAGING — CR DG CHEST 1V PORT
1 series · 1 of 1 positions shown · non-contrast
Comparison: 05/06/2014.

CLINICAL DATA: Cardiac arrest.  Central line placement.

EXAM:
PORTABLE CHEST - 1 VIEW

[AP]
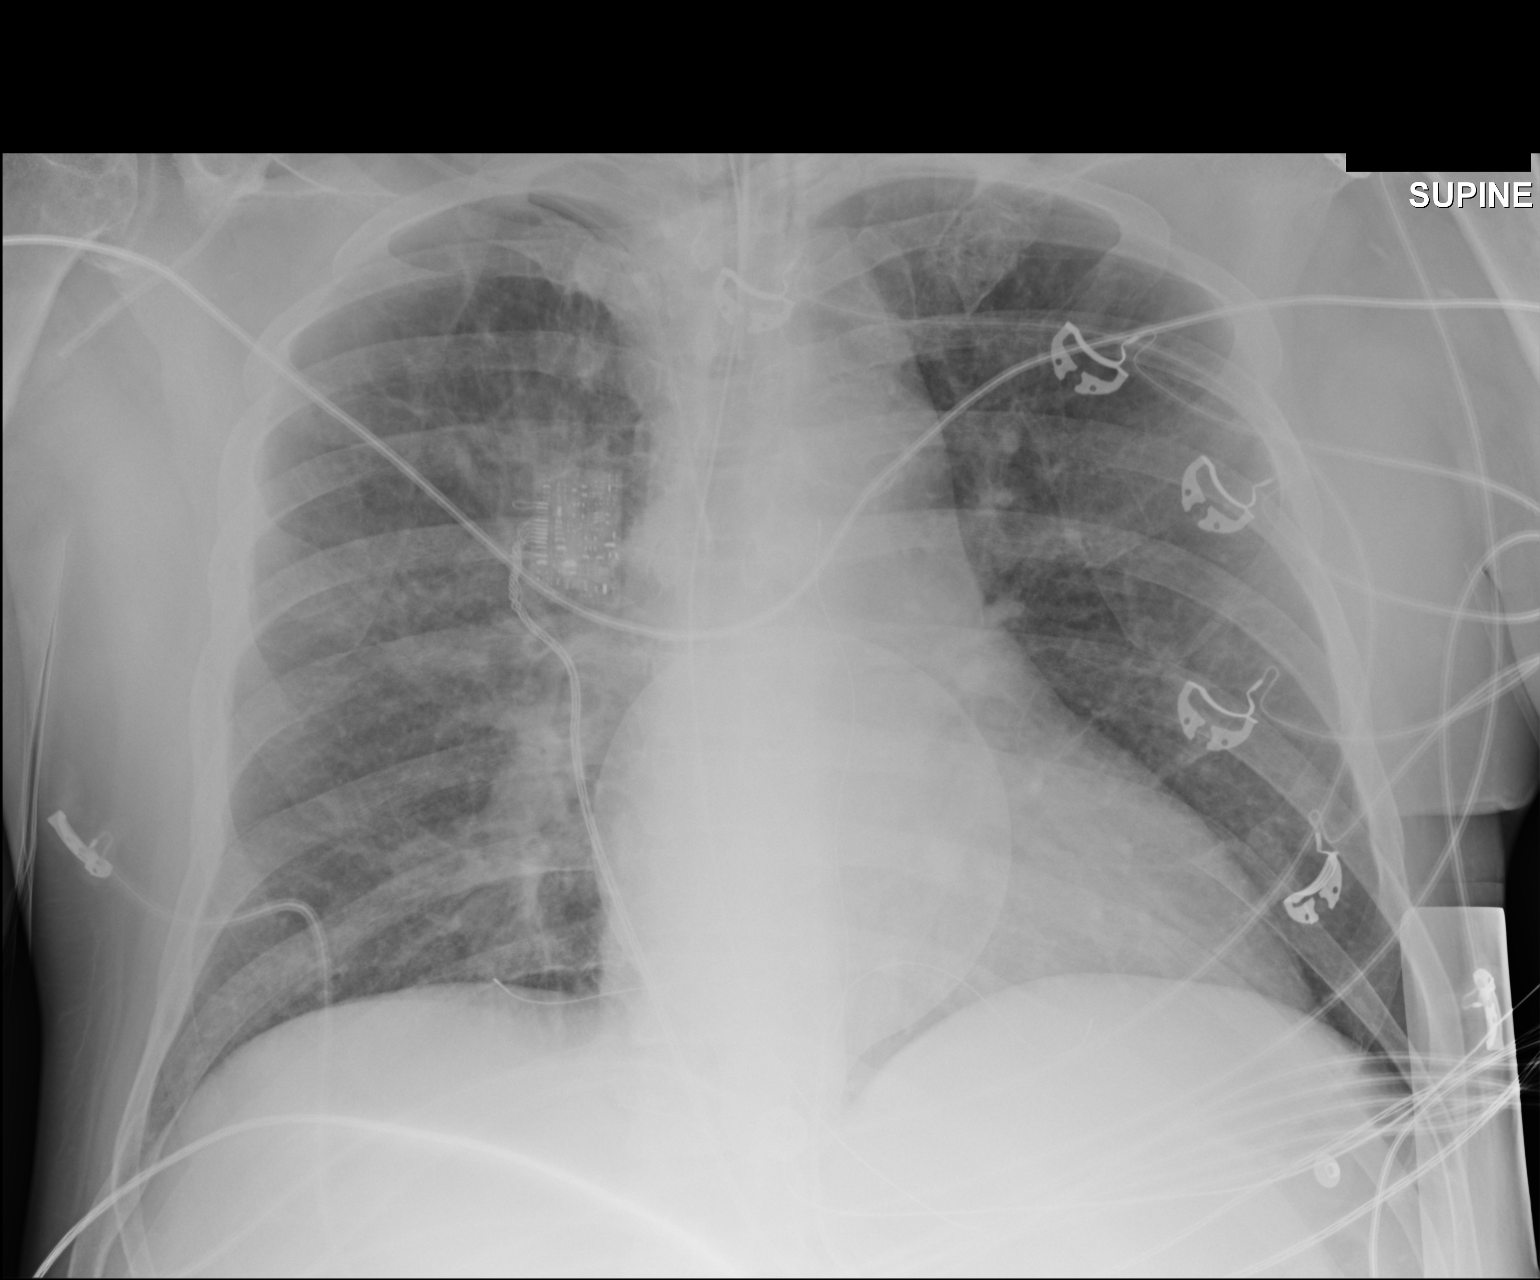

[1 of 1 positions shown; findings below may reference images not displayed]

FINDINGS: 8855 hr. Tip of the endotracheal tube is in the mid trachea. A
nasogastric tube projects just below the diaphragm, although its
side port is in the distal esophagus. Right subclavian central
venous catheter extends to the mid SVC level. The heart size is
stable. There is interval improved aeration of both lung bases. No
pneumothorax or significant pleural effusion is seen. Telemetry
leads and an external pacer overlie the chest. No fractures are
seen.
IMPRESSION: Support system positioning as described. The nasogastric tube
projects into the proximal stomach. Interval improved aeration of
both lung bases.

## 2015-03-10 IMAGING — CT CT HEAD W/O CM
1 series · 16 of 30 positions shown, 20 images · non-contrast
Comparison: 05/06/2014

CLINICAL DATA: Acutely dilated left pupil

EXAM:
CT HEAD WITHOUT CONTRAST
TECHNIQUE: Contiguous axial images were obtained from the base of the skull
through the vertex without intravenous contrast.

[Series 2: head 5.0 h30s · axial · 0.44mm/px · z∈[+1263,+1418]mm · 16 of 35 slices shown, 20 images]
[im 2/35  brain]
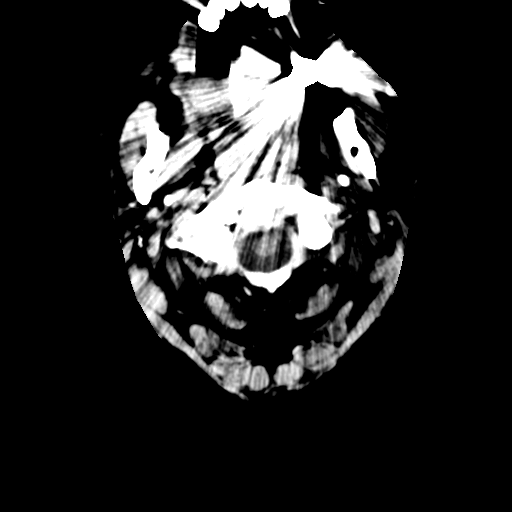
[im 2/35  bone]
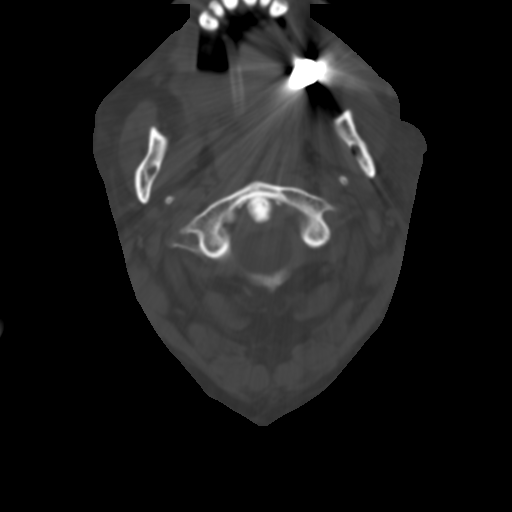
[im 4/35  brain]
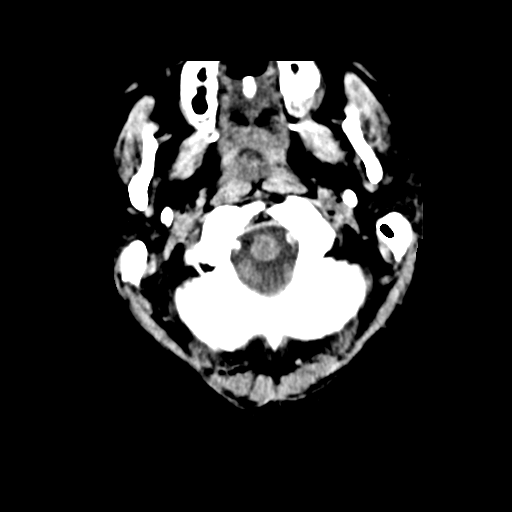
[im 6/35  brain]
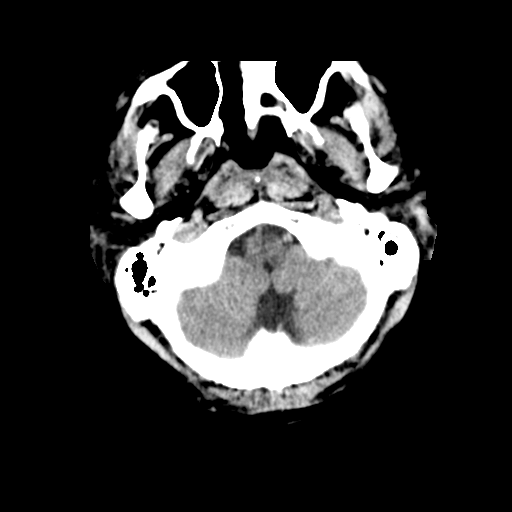
[im 9/35  brain]
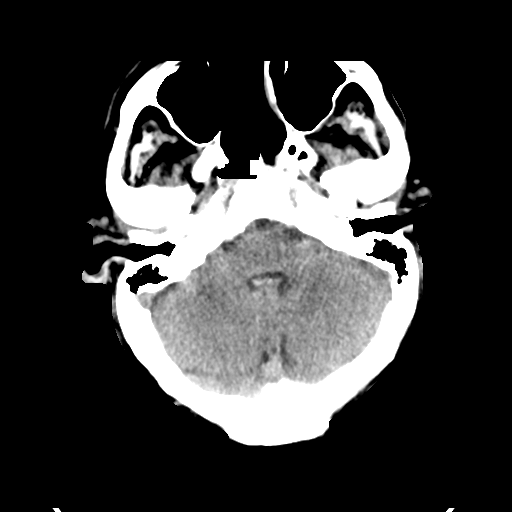
[im 10/35  brain]
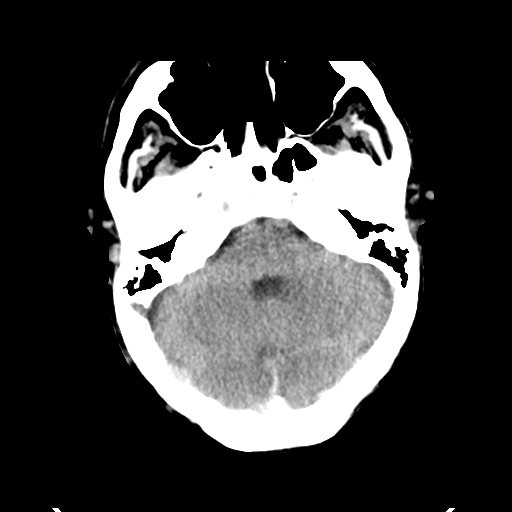
[im 10/35  bone]
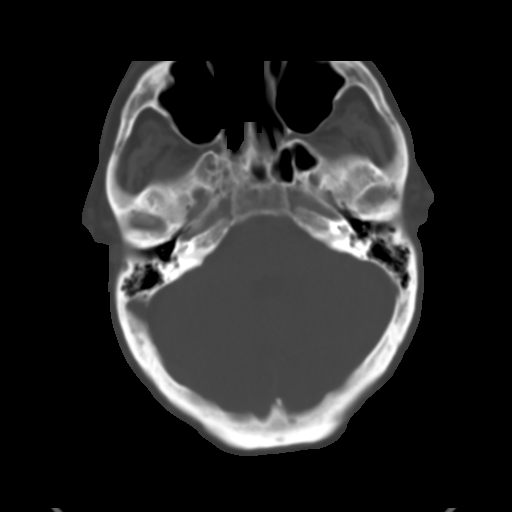
[im 12/35  brain]
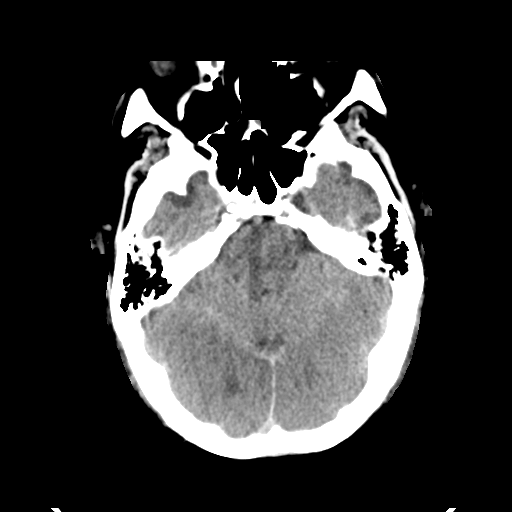
[im 15/35  brain]
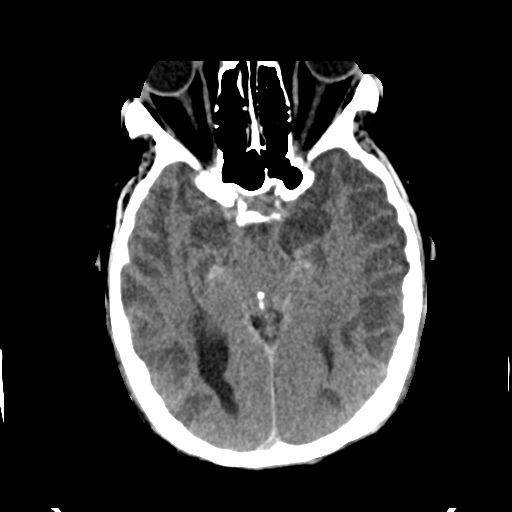
[im 17/35  brain]
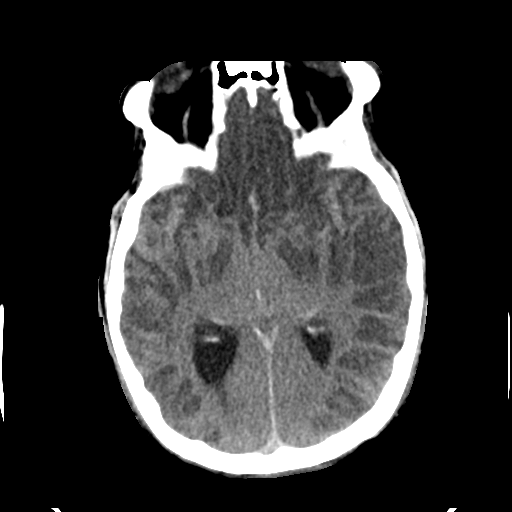
[im 18/35  brain]
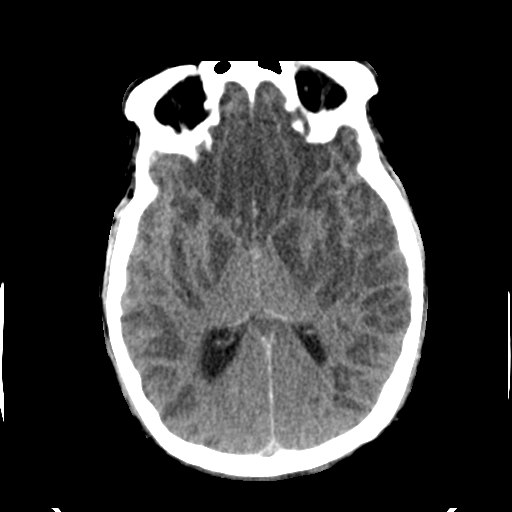
[im 18/35  bone]
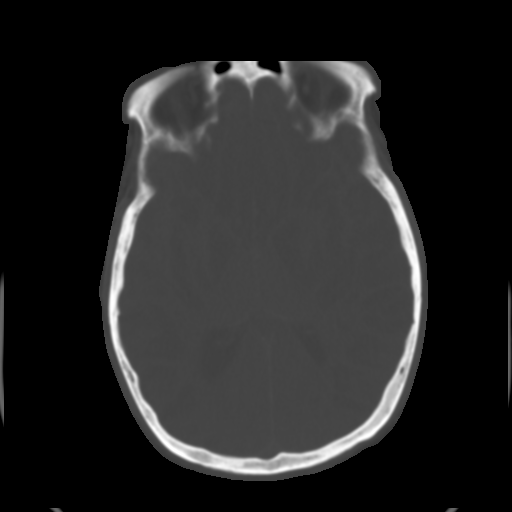
[im 20/35  brain]
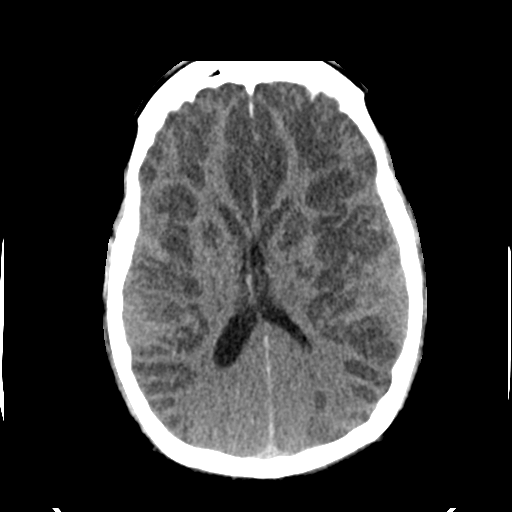
[im 23/35  brain]
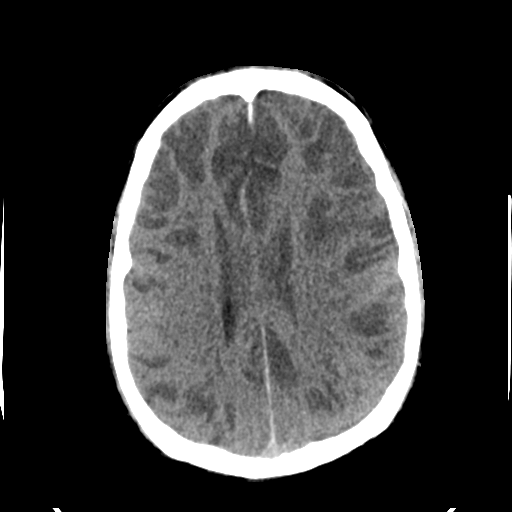
[im 25/35  brain]
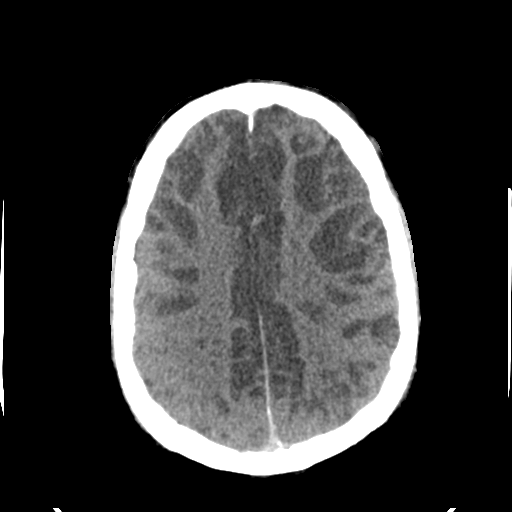
[im 26/35  brain]
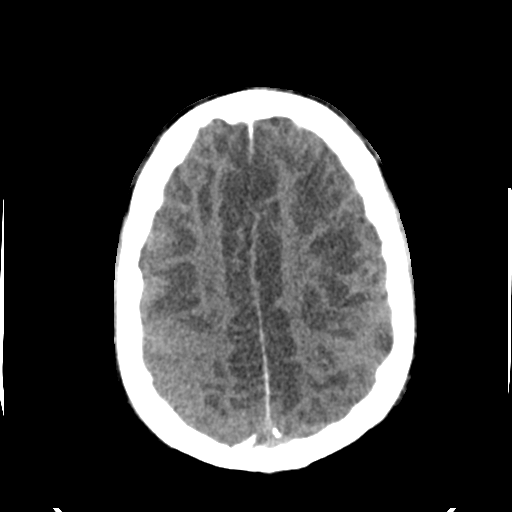
[im 26/35  bone]
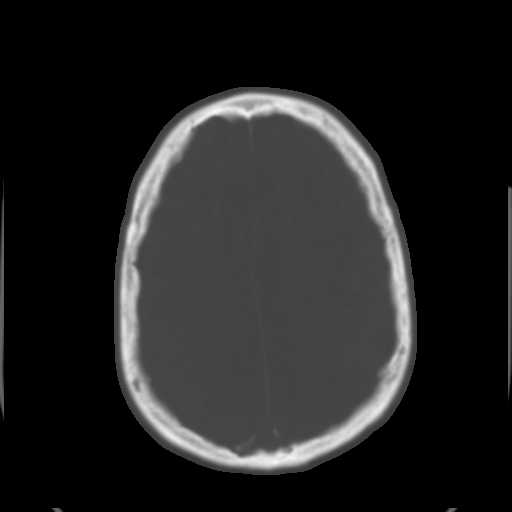
[im 29/35  brain]
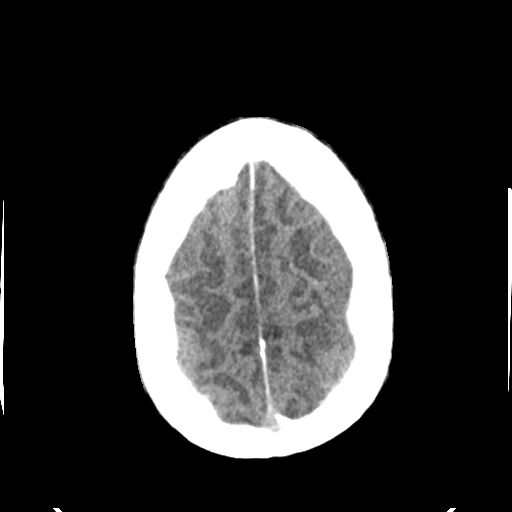
[im 31/35  brain]
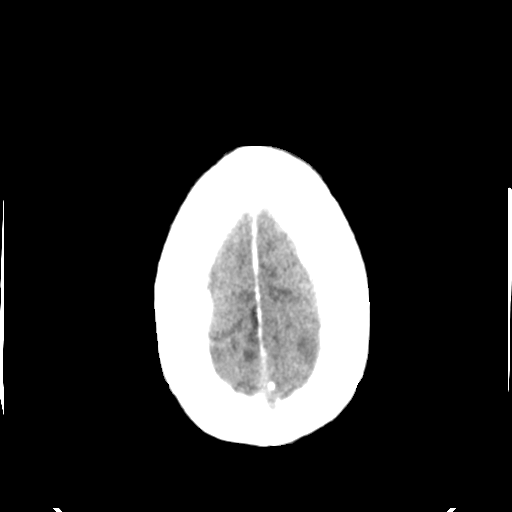
[im 33/35  brain]
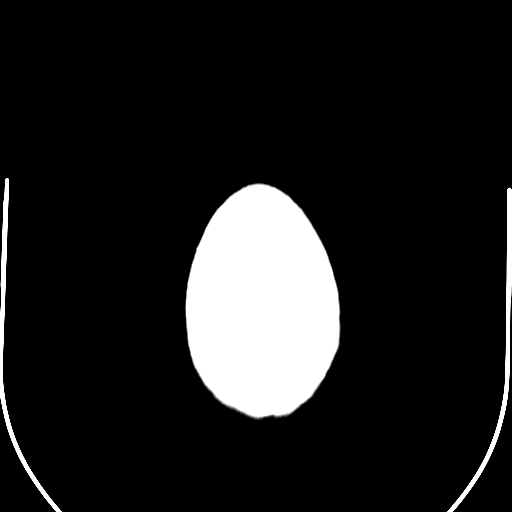

[16 of 30 positions shown; findings below may reference images not displayed]

FINDINGS: Since the study of yesterday, there is diffuse cortical low density
consistent with diffuse cortical infarction. There is brain swelling
with flattening of the ventricles. Posterior fossa structures are
not visibly involved. No hemorrhage. No shift. Threatening uncal
herniation because of the supratentorial swelling. No calvarial
abnormality. Sinuses are clear.
IMPRESSION: Diffuse cortical infarction affecting the cerebral hemispheres.
Diffuse brain swelling with flattening of the ventricles and
threatening uncal herniation.

These results were called by telephone at the time of interpretation
on 05/07/2014 at [DATE] to Dr. MARIA SOCCORSA WILD , who verbally
acknowledged these results.
